# Patient Record
Sex: Female | Born: 1935 | Race: White | Hispanic: No
Health system: Southern US, Community
[De-identification: ages and names within clinical notes are randomized; demographics above are authoritative.]

---

## 2001-08-30 ENCOUNTER — Ambulatory Visit: Admission: RE | Admit: 2001-08-30 | Discharge: 2001-08-30 | Payer: Self-pay | Admitting: Pulmonary Disease

## 2015-08-21 ENCOUNTER — Other Ambulatory Visit: Payer: Self-pay | Admitting: Internal Medicine

## 2015-08-21 DIAGNOSIS — R911 Solitary pulmonary nodule: Secondary | ICD-10-CM

## 2015-08-24 ENCOUNTER — Ambulatory Visit
Admission: RE | Admit: 2015-08-24 | Discharge: 2015-08-24 | Disposition: A | Discharge status: Home / Self Care | Payer: Medicare HMO | Source: Ambulatory Visit | Attending: Internal Medicine | Admitting: Internal Medicine

## 2015-08-24 DIAGNOSIS — R911 Solitary pulmonary nodule: Secondary | ICD-10-CM

## 2015-10-22 ENCOUNTER — Inpatient Hospital Stay (HOSPITAL_COMMUNITY): Payer: Medicare HMO

## 2015-10-22 ENCOUNTER — Inpatient Hospital Stay (HOSPITAL_COMMUNITY)
Admission: EM | Admit: 2015-10-22 | Discharge: 2015-11-21 | DRG: 871 | Disposition: E | Payer: Medicare HMO | Source: Other Acute Inpatient Hospital | Attending: Internal Medicine | Admitting: Internal Medicine

## 2015-10-22 ENCOUNTER — Other Ambulatory Visit (HOSPITAL_COMMUNITY): Payer: Medicare HMO

## 2015-10-22 DIAGNOSIS — I4901 Ventricular fibrillation: Secondary | ICD-10-CM | POA: Diagnosis not present

## 2015-10-22 DIAGNOSIS — R079 Chest pain, unspecified: Secondary | ICD-10-CM | POA: Diagnosis present

## 2015-10-22 DIAGNOSIS — Z452 Encounter for adjustment and management of vascular access device: Secondary | ICD-10-CM | POA: Insufficient documentation

## 2015-10-22 DIAGNOSIS — F329 Major depressive disorder, single episode, unspecified: Secondary | ICD-10-CM | POA: Diagnosis present

## 2015-10-22 DIAGNOSIS — R6521 Severe sepsis with septic shock: Secondary | ICD-10-CM | POA: Diagnosis present

## 2015-10-22 DIAGNOSIS — Z91013 Allergy to seafood: Secondary | ICD-10-CM

## 2015-10-22 DIAGNOSIS — R579 Shock, unspecified: Secondary | ICD-10-CM | POA: Insufficient documentation

## 2015-10-22 DIAGNOSIS — E876 Hypokalemia: Secondary | ICD-10-CM | POA: Diagnosis present

## 2015-10-22 DIAGNOSIS — N179 Acute kidney failure, unspecified: Secondary | ICD-10-CM | POA: Diagnosis present

## 2015-10-22 DIAGNOSIS — R569 Unspecified convulsions: Secondary | ICD-10-CM | POA: Diagnosis present

## 2015-10-22 DIAGNOSIS — I10 Essential (primary) hypertension: Secondary | ICD-10-CM | POA: Diagnosis present

## 2015-10-22 DIAGNOSIS — J45909 Unspecified asthma, uncomplicated: Secondary | ICD-10-CM | POA: Diagnosis present

## 2015-10-22 DIAGNOSIS — K529 Noninfective gastroenteritis and colitis, unspecified: Secondary | ICD-10-CM | POA: Diagnosis present

## 2015-10-22 DIAGNOSIS — Z79899 Other long term (current) drug therapy: Secondary | ICD-10-CM | POA: Diagnosis not present

## 2015-10-22 DIAGNOSIS — R401 Stupor: Secondary | ICD-10-CM | POA: Diagnosis not present

## 2015-10-22 DIAGNOSIS — J9600 Acute respiratory failure, unspecified whether with hypoxia or hypercapnia: Secondary | ICD-10-CM | POA: Diagnosis present

## 2015-10-22 DIAGNOSIS — R402412 Glasgow coma scale score 13-15, at arrival to emergency department: Secondary | ICD-10-CM | POA: Diagnosis not present

## 2015-10-22 DIAGNOSIS — Z853 Personal history of malignant neoplasm of breast: Secondary | ICD-10-CM | POA: Diagnosis not present

## 2015-10-22 DIAGNOSIS — Z66 Do not resuscitate: Secondary | ICD-10-CM | POA: Diagnosis present

## 2015-10-22 DIAGNOSIS — T68XXXA Hypothermia, initial encounter: Secondary | ICD-10-CM | POA: Diagnosis present

## 2015-10-22 DIAGNOSIS — E785 Hyperlipidemia, unspecified: Secondary | ICD-10-CM | POA: Diagnosis present

## 2015-10-22 DIAGNOSIS — J811 Chronic pulmonary edema: Secondary | ICD-10-CM | POA: Diagnosis present

## 2015-10-22 DIAGNOSIS — E875 Hyperkalemia: Secondary | ICD-10-CM | POA: Diagnosis present

## 2015-10-22 DIAGNOSIS — E871 Hypo-osmolality and hyponatremia: Secondary | ICD-10-CM | POA: Diagnosis present

## 2015-10-22 DIAGNOSIS — G9340 Encephalopathy, unspecified: Secondary | ICD-10-CM | POA: Diagnosis not present

## 2015-10-22 DIAGNOSIS — R4182 Altered mental status, unspecified: Secondary | ICD-10-CM | POA: Insufficient documentation

## 2015-10-22 DIAGNOSIS — Z7982 Long term (current) use of aspirin: Secondary | ICD-10-CM

## 2015-10-22 DIAGNOSIS — E872 Acidosis: Secondary | ICD-10-CM | POA: Diagnosis present

## 2015-10-22 DIAGNOSIS — A419 Sepsis, unspecified organism: Principal | ICD-10-CM | POA: Insufficient documentation

## 2015-10-22 DIAGNOSIS — Z91018 Allergy to other foods: Secondary | ICD-10-CM

## 2015-10-22 LAB — COMPREHENSIVE METABOLIC PANEL
ALBUMIN: 2 g/dL — AB (ref 3.5–5.0)
ALK PHOS: 105 U/L (ref 38–126)
ALT: 12 U/L — ABNORMAL LOW (ref 14–54)
AST: 166 U/L — AB (ref 15–41)
Anion gap: 14 (ref 5–15)
BILIRUBIN TOTAL: 0.9 mg/dL (ref 0.3–1.2)
BUN: 58 mg/dL — AB (ref 6–20)
CALCIUM: 5.9 mg/dL — AB (ref 8.9–10.3)
CO2: 13 mmol/L — AB (ref 22–32)
Chloride: 94 mmol/L — ABNORMAL LOW (ref 101–111)
Creatinine, Ser: 6.44 mg/dL — ABNORMAL HIGH (ref 0.44–1.00)
GFR calc Af Amer: 6 mL/min — ABNORMAL LOW (ref >60)
GFR calc non Af Amer: 5 mL/min — ABNORMAL LOW (ref >60)
GLUCOSE: 167 mg/dL — AB (ref 65–99)
Potassium: 3.2 mmol/L — ABNORMAL LOW (ref 3.5–5.1)
SODIUM: 121 mmol/L — AB (ref 135–145)
TOTAL PROTEIN: 3.8 g/dL — AB (ref 6.5–8.1)

## 2015-10-22 LAB — PROCALCITONIN: PROCALCITONIN: 55.88 ng/mL

## 2015-10-22 LAB — APTT: APTT: 43 s — AB (ref 24–37)

## 2015-10-22 LAB — CBC WITH DIFFERENTIAL/PLATELET
BASOS PCT: 0 %
Basophils Absolute: 0 10*3/uL (ref 0.0–0.1)
EOS ABS: 0 10*3/uL (ref 0.0–0.7)
Eosinophils Relative: 0 %
HEMATOCRIT: 31.6 % — AB (ref 36.0–46.0)
Hemoglobin: 11.4 g/dL — ABNORMAL LOW (ref 12.0–15.0)
LYMPHS ABS: 0.6 10*3/uL — AB (ref 0.7–4.0)
Lymphocytes Relative: 2 %
MCH: 31.8 pg (ref 26.0–34.0)
MCHC: 36.1 g/dL — AB (ref 30.0–36.0)
MCV: 88 fL (ref 78.0–100.0)
MONO ABS: 0.9 10*3/uL (ref 0.1–1.0)
Monocytes Relative: 3 %
NEUTROS PCT: 95 %
Neutro Abs: 27.7 10*3/uL — ABNORMAL HIGH (ref 1.7–7.7)
PLATELETS: 216 10*3/uL (ref 150–400)
RBC: 3.59 MIL/uL — ABNORMAL LOW (ref 3.87–5.11)
RDW: 13.2 % (ref 11.5–15.5)
WBC: 29.2 10*3/uL — AB (ref 4.0–10.5)

## 2015-10-22 LAB — BLOOD GAS, ARTERIAL
Acid-base deficit: 14.6 mmol/L — ABNORMAL HIGH (ref 0.0–2.0)
BICARBONATE: 11.3 meq/L — AB (ref 20.0–24.0)
Drawn by: 39899
FIO2: 0.6
LHR: 20 {breaths}/min
MECHVT: 460 mL
O2 Saturation: 99 %
PATIENT TEMPERATURE: 93.6
PEEP/CPAP: 5 cmH2O
PO2 ART: 236 mmHg — AB (ref 80.0–100.0)
TCO2: 12.1 mmol/L (ref 0–100)
pCO2 arterial: 23.8 mmHg — ABNORMAL LOW (ref 35.0–45.0)
pH, Arterial: 7.279 — ABNORMAL LOW (ref 7.350–7.450)

## 2015-10-22 LAB — URINALYSIS, ROUTINE W REFLEX MICROSCOPIC
Bilirubin Urine: NEGATIVE
Glucose, UA: 100 mg/dL — AB
KETONES UR: NEGATIVE mg/dL
NITRITE: NEGATIVE
Specific Gravity, Urine: 1.02 (ref 1.005–1.030)
pH: 5 (ref 5.0–8.0)

## 2015-10-22 LAB — GLUCOSE, CAPILLARY
GLUCOSE-CAPILLARY: 124 mg/dL — AB (ref 65–99)
Glucose-Capillary: 136 mg/dL — ABNORMAL HIGH (ref 65–99)

## 2015-10-22 LAB — PROTIME-INR
INR: 1.92 — ABNORMAL HIGH (ref 0.00–1.49)
Prothrombin Time: 21.9 seconds — ABNORMAL HIGH (ref 11.6–15.2)

## 2015-10-22 LAB — URINE MICROSCOPIC-ADD ON

## 2015-10-22 LAB — AMYLASE: Amylase: 214 U/L — ABNORMAL HIGH (ref 28–100)

## 2015-10-22 LAB — MRSA PCR SCREENING: MRSA by PCR: NEGATIVE

## 2015-10-22 LAB — TYPE AND SCREEN
ABO/RH(D): A POS
ANTIBODY SCREEN: NEGATIVE

## 2015-10-22 LAB — BASIC METABOLIC PANEL
ANION GAP: 11 (ref 5–15)
BUN: 55 mg/dL — AB (ref 6–20)
CHLORIDE: 100 mmol/L — AB (ref 101–111)
CO2: 11 mmol/L — ABNORMAL LOW (ref 22–32)
Calcium: 5.3 mg/dL — CL (ref 8.9–10.3)
Creatinine, Ser: 5.73 mg/dL — ABNORMAL HIGH (ref 0.44–1.00)
GFR calc Af Amer: 7 mL/min — ABNORMAL LOW (ref >60)
GFR, EST NON AFRICAN AMERICAN: 6 mL/min — AB (ref >60)
GLUCOSE: 150 mg/dL — AB (ref 65–99)
POTASSIUM: 3 mmol/L — AB (ref 3.5–5.1)
SODIUM: 122 mmol/L — AB (ref 135–145)

## 2015-10-22 LAB — PHOSPHORUS: Phosphorus: 7.1 mg/dL — ABNORMAL HIGH (ref 2.5–4.6)

## 2015-10-22 LAB — BRAIN NATRIURETIC PEPTIDE: B NATRIURETIC PEPTIDE 5: 376.4 pg/mL — AB (ref 0.0–100.0)

## 2015-10-22 LAB — MAGNESIUM: Magnesium: 2.1 mg/dL (ref 1.7–2.4)

## 2015-10-22 LAB — LACTIC ACID, PLASMA: LACTIC ACID, VENOUS: 2.8 mmol/L — AB (ref 0.5–2.0)

## 2015-10-22 LAB — LIPASE, BLOOD: LIPASE: 144 U/L — AB (ref 11–51)

## 2015-10-22 LAB — LACTATE DEHYDROGENASE: LDH: 695 U/L — AB (ref 98–192)

## 2015-10-22 LAB — ABO/RH: ABO/RH(D): A POS

## 2015-10-22 LAB — STREP PNEUMONIAE URINARY ANTIGEN: STREP PNEUMO URINARY ANTIGEN: NEGATIVE

## 2015-10-22 LAB — TROPONIN I: Troponin I: 0.19 ng/mL — ABNORMAL HIGH (ref <0.031)

## 2015-10-22 LAB — TSH: TSH: 0.66 u[IU]/mL (ref 0.350–4.500)

## 2015-10-22 LAB — D-DIMER, QUANTITATIVE: D-Dimer, Quant: >20 ug/mL-FEU — ABNORMAL HIGH (ref 0.00–0.50)

## 2015-10-22 LAB — CORTISOL: Cortisol, Plasma: 84 ug/dL

## 2015-10-22 MED ORDER — ASPIRIN 300 MG RE SUPP
300.0000 mg | RECTAL | Status: AC
  Administered 2015-10-22: 300 mg via RECTAL
  Filled 2015-10-22: qty 1

## 2015-10-22 MED ORDER — HEPARIN BOLUS VIA INFUSION
4000.0000 [IU] | Freq: Once | INTRAVENOUS | Status: DC
  Filled 2015-10-22: qty 4000

## 2015-10-22 MED ORDER — VITAL HIGH PROTEIN PO LIQD
1000.0000 mL | ORAL | Status: DC
  Filled 2015-10-22 (×3): qty 1000

## 2015-10-22 MED ORDER — SODIUM CHLORIDE 0.9 % IV BOLUS (SEPSIS)
1000.0000 mL | INTRAVENOUS | Status: AC
  Administered 2015-10-22 (×2): 1000 mL via INTRAVENOUS

## 2015-10-22 MED ORDER — HYDROCORTISONE NA SUCCINATE PF 100 MG IJ SOLR
50.0000 mg | Freq: Four times a day (QID) | INTRAMUSCULAR | Status: DC
  Administered 2015-10-22 – 2015-10-23 (×3): 50 mg via INTRAVENOUS
  Filled 2015-10-22 (×3): qty 1
  Filled 2015-10-22 (×2): qty 2
  Filled 2015-10-22: qty 1
  Filled 2015-10-22: qty 2

## 2015-10-22 MED ORDER — ASPIRIN 81 MG PO CHEW: 324.0000 mg | CHEWABLE_TABLET | ORAL | Status: AC

## 2015-10-22 MED ORDER — SODIUM CHLORIDE 0.9 % IV SOLN: 250.0000 mL | INTRAVENOUS | Status: DC | PRN

## 2015-10-22 MED ORDER — PRO-STAT SUGAR FREE PO LIQD
30.0000 mL | Freq: Two times a day (BID) | ORAL | Status: DC
  Administered 2015-10-22 – 2015-10-23 (×2): 30 mL
  Filled 2015-10-22 (×3): qty 30

## 2015-10-22 MED ORDER — POTASSIUM CHLORIDE 10 MEQ/50ML IV SOLN
10.0000 meq | INTRAVENOUS | Status: AC
  Administered 2015-10-22 (×3): 10 meq via INTRAVENOUS
  Filled 2015-10-22 (×2): qty 50

## 2015-10-22 MED ORDER — HEPARIN SODIUM (PORCINE) 5000 UNIT/ML IJ SOLN
5000.0000 [IU] | Freq: Three times a day (TID) | INTRAMUSCULAR | Status: DC
Start: 2015-10-22 — End: 2015-10-24
  Administered 2015-10-22 – 2015-10-24 (×6): 5000 [IU] via SUBCUTANEOUS
  Filled 2015-10-22 (×10): qty 1

## 2015-10-22 MED ORDER — HEPARIN (PORCINE) IN NACL 100-0.45 UNIT/ML-% IJ SOLN: 1150.0000 [IU]/h | INTRAMUSCULAR | Status: DC

## 2015-10-22 MED ORDER — SODIUM CHLORIDE 0.9 % IV BOLUS (SEPSIS)
500.0000 mL | INTRAVENOUS | Status: AC
  Administered 2015-10-22: 500 mL via INTRAVENOUS

## 2015-10-22 MED ORDER — INSULIN ASPART 100 UNIT/ML ~~LOC~~ SOLN
0.0000 [IU] | SUBCUTANEOUS | Status: DC
  Administered 2015-10-22 – 2015-10-23 (×3): 1 [IU] via SUBCUTANEOUS
  Administered 2015-10-24: 2 [IU] via SUBCUTANEOUS

## 2015-10-22 MED ORDER — PANTOPRAZOLE SODIUM 40 MG PO PACK
40.0000 mg | PACK | Freq: Every day | ORAL | Status: DC
  Administered 2015-10-23: 40 mg
  Filled 2015-10-22 (×3): qty 20

## 2015-10-22 MED ORDER — POTASSIUM CHLORIDE 10 MEQ/50ML IV SOLN
10.0000 meq | INTRAVENOUS | Status: DC
  Administered 2015-10-22 (×2): 10 meq via INTRAVENOUS
  Filled 2015-10-22 (×3): qty 50

## 2015-10-22 MED ORDER — DEXTROSE 5 % IV SOLN
2.0000 g | INTRAVENOUS | Status: DC
  Administered 2015-10-22: 2 g via INTRAVENOUS
  Filled 2015-10-22 (×2): qty 2

## 2015-10-22 MED ORDER — LEVOFLOXACIN IN D5W 750 MG/150ML IV SOLN
750.0000 mg | Freq: Once | INTRAVENOUS | Status: AC
  Administered 2015-10-22: 750 mg via INTRAVENOUS
  Filled 2015-10-22: qty 150

## 2015-10-22 MED ORDER — NOREPINEPHRINE BITARTRATE 1 MG/ML IV SOLN
5.0000 ug/min | INTRAVENOUS | Status: DC
  Administered 2015-10-22 – 2015-10-23 (×2): 50 ug/min via INTRAVENOUS
  Administered 2015-10-24: 40 ug/min via INTRAVENOUS
  Filled 2015-10-22 (×4): qty 16

## 2015-10-22 MED ORDER — SODIUM CHLORIDE 0.9 % IV SOLN
25.0000 ug/h | INTRAVENOUS | Status: DC
  Administered 2015-10-22: 75 ug/h via INTRAVENOUS
  Administered 2015-10-23: 200 ug/h via INTRAVENOUS
  Filled 2015-10-22 (×3): qty 50

## 2015-10-22 MED ORDER — SODIUM CHLORIDE 0.9 % IV SOLN
1.0000 g | Freq: Once | INTRAVENOUS | Status: AC
  Administered 2015-10-22: 1 g via INTRAVENOUS
  Filled 2015-10-22: qty 10

## 2015-10-22 MED ORDER — NOREPINEPHRINE BITARTRATE 1 MG/ML IV SOLN
5.0000 ug/min | INTRAVENOUS | Status: DC
  Administered 2015-10-22: 50 ug/min via INTRAVENOUS
  Administered 2015-10-22: 10 ug/min via INTRAVENOUS
  Administered 2015-10-22: 50 ug/min via INTRAVENOUS
  Filled 2015-10-22 (×2): qty 4

## 2015-10-22 MED ORDER — SODIUM CHLORIDE 0.9 % IV SOLN
10.0000 ug/h | Freq: Once | INTRAVENOUS | Status: AC
  Administered 2015-10-22: 10 ug/h via INTRAVENOUS
  Filled 2015-10-22: qty 50

## 2015-10-22 MED ORDER — SODIUM CHLORIDE 0.9 % IV SOLN
1250.0000 mg | Freq: Once | INTRAVENOUS | Status: AC
  Administered 2015-10-22: 1250 mg via INTRAVENOUS
  Filled 2015-10-22: qty 1250

## 2015-10-22 MED ORDER — LEVOFLOXACIN IN D5W 500 MG/100ML IV SOLN: 500.0000 mg | INTRAVENOUS | Status: DC

## 2015-10-22 MED ORDER — SODIUM CHLORIDE 0.9 % IV SOLN
INTRAVENOUS | Status: DC
  Administered 2015-10-22: 150 mL/h via INTRAVENOUS

## 2015-10-22 NOTE — Progress Notes (Addendum)
ANTIBIOTIC CONSULT NOTE - INITIAL  Pharmacy Consult for Levaquin and Vancomycin Indication: rule out sepsis  Allergies not on file  Patient Measurements: Weight: 150 lb 12.7 oz (68.4 kg)  Vital Signs: Temp: 93.7 F (34.3 C) (01/02 1615) Temp Source: Core (Comment) (01/02 1615) BP: 85/39 mmHg (01/02 1614) Pulse Rate: 93 (01/02 1614) Intake/Output from previous day:   Intake/Output from this shift:    Labs: No results for input(s): WBC, HGB, PLT, LABCREA, CREATININE in the last 72 hours. CrCl cannot be calculated (Unknown ideal weight.). No results for input(s): VANCOTROUGH, VANCOPEAK, VANCORANDOM, GENTTROUGH, GENTPEAK, GENTRANDOM, TOBRATROUGH, TOBRAPEAK, TOBRARND, AMIKACINPEAK, AMIKACINTROU, AMIKACIN in the last 72 hours.   Microbiology: No results found for this or any previous visit (from the past 720 hour(s)).  Medical History: No past medical history on file.  Assessment: 82 yo F [facility] recent wrist surgery. Had acute chest pain and resp failure and had to be intubated. Found to be hypothermic and septic so pharmacy consulted to dose abx. Labs pending.  Goal of Therapy:  Vancomycin trough level 15-20 mcg/ml  Resolution of infection  Plan:  Give vancomycin 1.25g IV x 1 Give Levaquin 750mg  IV x 1 Change ceftriaxone to 2g IV Q24 per MD Monitor clinical picture, renal function, VT at Css F/U labs for further dosing, C&S, abx deescalation / LOT  Enzo BiNathan Jamaya Sleeth, PharmD, BCPS Clinical Pharmacist Pager [phone] 11/05/2015 4:48 PM  ADDENDUM:  SCr came back elevated at 6.44. CrCl ~5-7110ml/min. WBC elevated at 29.2.  Plan: Continue Levaquin 500mg  IV Q48 starting on 1/4 Plan to check ~48 hr level (1/4 in the evening) or follow up renal plans for patient for further vanc dosing Monitor clinical picture, renal function, VR prn F/U C&S, abx deescalation / LOT

## 2015-10-22 NOTE — Procedures (Signed)
Central Venous Catheter Insertion Procedure Note Catherine Ward [id number] [birth year]  Procedure: Insertion of Central Venous Catheter Indications: Assessment of intravascular volume, Drug and/or fluid administration, Frequent blood sampling and in case HD, also need cvp, access  Procedure Details Consent: Unable to obtain consent because of emergent medical necessity. Time Out: Verified patient identification, verified procedure, site/side was marked, verified correct patient position, special equipment/implants available, medications/allergies/relevent history reviewed, required imaging and test results available.  Performed  Maximum sterile technique was used including antiseptics, cap, gloves, gown, hand hygiene, mask and sheet. Skin prep: Chlorhexidine; local anesthetic administered A antimicrobial bonded/coated triple lumen catheter was placed in the right internal jugular vein using the Seldinger technique.  Evaluation Blood flow good Complications: No apparent complications Patient did tolerate procedure well. Chest X-ray ordered to verify placement.  CXR: pending.  Nelda BucksFEINSTEIN,DANIEL J. 11/04/2015, 5:16 PM  US  Mcarthur Rossettianiel J. Tyson AliasFeinstein, MD, FACP Pgr: [phone] [facility]

## 2015-10-22 NOTE — Progress Notes (Signed)
eLink Physician-Brief Progress Note Patient Name: Catherine Ward DOB: 01-07-1933 MRN: [id number]   Date of Service  11/12/2015  HPI/Events of Note  Patient with limited IV access and R IJ HD catheter. Patient requiring vasopressor and fentanyl infusion. Combative. Potassium only 3.0 still but LA improving. Camera check shows patient now comfortable post fentanyl bolus.  eICU Interventions  1. Plan to place L IJ CVL 2. KCl x4 more runs 3. Continue following electrolytes     Intervention Category Major Interventions: Sepsis - evaluation and management  Lawanda CousinsJennings Colbie Sliker 10/30/2015, 8:07 PM

## 2015-10-22 NOTE — Procedures (Signed)
Arterial Catheter Insertion Procedure Note Clayburn PertDoris J Graef [id number] 1933/06/04  Procedure: Insertion of Arterial Catheter  Indications: Blood pressure monitoring and Frequent blood sampling  Procedure Details Consent: Unable to obtain consent because of emergent medical necessity. Time Out: Verified patient identification, verified procedure, site/side was marked, verified correct patient position, special equipment/implants available, medications/allergies/relevent history reviewed, required imaging and test results available.  Performed  Maximum sterile technique was used including antiseptics, cap, gloves, gown, hand hygiene, mask and sheet. Skin prep: Chlorhexidine; local anesthetic administered 20 gauge catheter was inserted into left femoral artery using the Seldinger technique.  Evaluation Blood flow good; BP tracing good. Complications: No apparent complications.   Nelda BucksFEINSTEIN,Ollis Daudelin J. 11/19/2015  US  No radials  Mcarthur Rossettianiel J. Tyson AliasFeinstein, MD, FACP Pgr: [phone] [facility]

## 2015-10-22 NOTE — H&P (Signed)
PULMONARY / CRITICAL CARE MEDICINE   Name: Catherine Ward MRN: [id number] DOB: 09/25/1933    ADMISSION DATE:  11/17/2015 CONSULTATION DATE:  10/28/2015  REFERRING MD:  ER EDP Duke Salvia  CHIEF COMPLAINT:  Chest pain, resp failure, shock  HISTORY OF PRESENT ILLNESS:   82 yr old presents with recent wrist surgery presented to [facility] with acute chest pain, resp failure. NOted WBC 21 k, temp 95 f x 1. [facility]ed to [facility], [facility], intubated.  Found with MODS< ARF, crt 6, shock, levophed, fem lineplaced at [place], unable to give contrast for studies. CT abdo neg, no hydro on CT. Had SOB reported with CP.  Also with hyponatremia, LActic over 8 with all AG acidosis. Glu borderline 66. Appearing dry. Arrived to [facility] . NO disc, all labs only and reports.  ECG without st changes, trop neg.  PAST MEDICAL HISTORY :  She  has no past medical history on file.  Wrist surgery, depression, never smoker, HTN, Hyperlipidemia, Breast Cancer  PAST SURGICAL HISTORY: She  has no past surgical history on file.Tonsillectoy, Adnoid  Allergies not on file  No current facility-administered medications on file prior to encounter.   No current outpatient prescriptions on file prior to encounter.    FAMILY HISTORY:  Her has no family status information on file.   SOCIAL HISTORY: She   no etoh, no drug abus ein past  REVIEW OF SYSTEMS:   Unable  SUBJECTIVE:  vented  VITAL SIGNS: BP 85/39 mmHg  Pulse 93  Temp(Src) 93.7 F (34.3 C)  Resp 18  SpO2 67%  HEMODYNAMICS:    VENTILATOR SETTINGS: Vent Mode:  [-] PRVC FiO2 (%):  [60 %] 60 % Set Rate:  [16 bmp] 16 bmp Vt Set:  [460 mL] 460 mL PEEP:  [5 cmH20] 5 cmH20 Plateau Pressure:  [26 cmH20] 26 cmH20  INTAKE / OUTPUT:    PHYSICAL EXAMINATION: General: lethargic , int agitation Neuro:  perrl 2 mm, moving al ext equally HEENT:  jvd none, ett Cardiovascular:  s1 s2 RRR no r, distant Lungs:  CTA anterior, scar left chest Abdomen:   Soft, BS hypo, ND, tender mild diffuse  Musculoskeletal:  No edema Skin:  No rash  LABS:  BMET No results for input(s): NA, K, CL, CO2, BUN, CREATININE, GLUCOSE in the last 168 hours.  Electrolytes No results for input(s): CALCIUM, MG, PHOS in the last 168 hours.  CBC No results for input(s): WBC, HGB, HCT, PLT in the last 168 hours.  Coag's No results for input(s): APTT, INR in the last 168 hours.  Sepsis Markers No results for input(s): LATICACIDVEN, PROCALCITON, O2SATVEN in the last 168 hours.  ABG No results for input(s): PHART, PCO2ART, PO2ART in the last 168 hours.  Liver Enzymes No results for input(s): AST, ALT, ALKPHOS, BILITOT, ALBUMIN in the last 168 hours.  Cardiac Enzymes No results for input(s): TROPONINI, PROBNP in the last 168 hours.  Glucose No results for input(s): GLUCAP in the last 168 hours.  Imaging No results found.   STUDIES:  1/2 CT chest (NONcontrast)>>>nodule 1/2 CT abdo>>>gallstones LImited echo>>>rv wnl, LV wnl, hypovolemic appearing  CULTURES: 1/2 BC>>> 1/2 urine>>> 1/2 sputum>>> 1/2 leg>>> 1/2 strep  ANTIBIOTICS: 1/2 levo>>> 1/2 vanc>>> 1/2 ceftriaxone>>  SIGNIFICANT EVENTS: 1/2 shock, resp failure, lactic acidosis  LINES/TUBES: 1/2 ETT>>>   ASSESSMENT / PLAN:  PULMONARY A:Acute resp failure, metabolic acidosis, likely uncompensated R/o developing pNA P:   STAT ABG on vent Rate 20, 100% , peep 5  pcxr stat  CARDIOVASCULAR A:  Shock, r/o hypovolemic, septic Echo NOT c/w PE massive / obstructive Aorta noral size on NONCointrast CT P:  Levophed to MAP 60 STAT a line needed Stat cvp Cortisol tesh ecg Trop Lactic repeat offical echo IF declines would TEE for aortic dissection   RENAL A:   ARF, r/o; ATN, hypovolemia Hyponatremia Hypokalemia AG acidosis (lactic, uremia) P:   CT neg hydro, NO US needed BMET STAT and q6h May need bicarb if K up volue Saline cvp  GASTROINTESTINAL A:   Back  pain, r/o abdominal source, CVT neg , no contrast however On differential , dead bowel P:   CT reviewed, if declines, obtain with oral contrast and may need surgical cosnult if no other source noted ppi LFT, amy,lip repeat, ldh Npo Volume  HEMATOLOGIC A:   DVt  Prevention Echo reassuring of PE P:  Low threshold empiric heparin  INFECTIOUS A:   R/o bacteremia, septic shock UTI mild unlikely source R/o developing PNA P:   STAT pcxr, ua Empiric Levofloxacin 1/2>>> vanc 1/2>>> Ceftriaxone 1/2>>>  Get leg, strep ag  ENDOCRINE A:   R/o Adrenal insufff P:   tsh Cortisol cbg  NEUROLOGIC A:   Septic enceph likely P:   RASS goal: -1 May need CT head fent required  FAMILY  - Updates: no family  - Inter-disciplinary family meet or Palliative Care meeting due by:  10/29/15  Ccm time 90 min  Mcarthur Rossettianiel J. Tyson AliasFeinstein, MD, FACP Pgr: [phone] [place] [facility]  Pulmonary and Critical Care Medicine [facility] Pager: [phone]  11/11/2015, 4:25 PM

## 2015-10-22 NOTE — Progress Notes (Signed)
VASCULAR LAB PRELIMINARY  PRELIMINARY  PRELIMINARY  PRELIMINARY  Bilateral lower extremity venous duplex completed.    Preliminary report:  Bilateral:  No obvious evidence of DVT, superficial thrombosis, or Baker's Cyst. Technically difficult and limited due to line placement in the groin bilaterally, suboptimal positioning of the patient , and the warming blanket.   Shelden Raborn, RVS 11/18/2015, 7:02 PM

## 2015-10-22 NOTE — Procedures (Signed)
Echo, limited stat  1. LV small cavity, hypovolemic, mid cavity gradient 2. RV kinetic and slender (not c/w massive PE) 3. small to no sig pericardial effusion 4. Poor apical images  Mcarthur RossettiDaniel J. Tyson AliasFeinstein, MD, FACP Pgr: [phone] [place] [facility]

## 2015-10-22 NOTE — Progress Notes (Addendum)
ANTICOAGULATION CONSULT NOTE - Initial Consult  Pharmacy Consult for heparin Indication: VTE treatment  Allergies not on file  Patient Measurements: TBW 68.4 kg IBW ? But looks to be around IBW  Vital Signs: Temp: 93.7 F (34.3 C) (01/02 1615) Temp Source: Core (Comment) (01/02 1615) BP: 85/39 mmHg (01/02 1614) Pulse Rate: 93 (01/02 1614)  Labs: No results for input(s): HGB, HCT, PLT, APTT, LABPROT, INR, HEPARINUNFRC, CREATININE, CKTOTAL, CKMB, TROPONINI in the last 72 hours.  CrCl cannot be calculated (Unknown ideal weight.).   Medical History: No past medical history on file.  Assessment: 82 yo F [facility] wrist [facility]. Had acute chest pain and resp failure and had to be intubated. Worried about acute PE so pharmacy consulted to start heparin. Labs in process. No anticoag PTA.   Goal of Therapy:  Heparin level 0.3-0.7 units/ml Monitor platelets by anticoagulation protocol: Yes   Plan:  Give 4,000 heparin BOLUS Start heparin gtt at 1,150 units/hr Check 8 hr HL Monitor daily HL, CBC, s/s of bleed  Enzo BiNathan Chantele Corado, PharmD, BCPS Clinical Pharmacist Pager [phone] 10/26/2015 4:37 PM   ADDENDUM:  Heparin stopped before it started per MD. PE not likely.

## 2015-10-23 ENCOUNTER — Inpatient Hospital Stay (HOSPITAL_COMMUNITY): Payer: Medicare HMO

## 2015-10-23 DIAGNOSIS — R401 Stupor: Secondary | ICD-10-CM

## 2015-10-23 DIAGNOSIS — R4182 Altered mental status, unspecified: Secondary | ICD-10-CM | POA: Insufficient documentation

## 2015-10-23 DIAGNOSIS — A419 Sepsis, unspecified organism: Principal | ICD-10-CM

## 2015-10-23 LAB — GLUCOSE, CAPILLARY
GLUCOSE-CAPILLARY: 138 mg/dL — AB (ref 65–99)
GLUCOSE-CAPILLARY: 44 mg/dL — AB (ref 65–99)
GLUCOSE-CAPILLARY: 91 mg/dL (ref 65–99)
Glucose-Capillary: 56 mg/dL — ABNORMAL LOW (ref 65–99)
Glucose-Capillary: 85 mg/dL (ref 65–99)
Glucose-Capillary: 87 mg/dL (ref 65–99)
Glucose-Capillary: 71 mg/dL (ref 65–99)

## 2015-10-23 LAB — BASIC METABOLIC PANEL
ANION GAP: 10 (ref 5–15)
ANION GAP: 9 (ref 5–15)
BUN: 59 mg/dL — ABNORMAL HIGH (ref 6–20)
BUN: 53 mg/dL — AB (ref 6–20)
CHLORIDE: 99 mmol/L — AB (ref 101–111)
CHLORIDE: 102 mmol/L (ref 101–111)
CO2: 12 mmol/L — ABNORMAL LOW (ref 22–32)
CO2: 9 mmol/L — ABNORMAL LOW (ref 22–32)
Calcium: 5.3 mg/dL — CL (ref 8.9–10.3)
Calcium: 5.5 mg/dL — CL (ref 8.9–10.3)
Creatinine, Ser: 5.68 mg/dL — ABNORMAL HIGH (ref 0.44–1.00)
Creatinine, Ser: 6.26 mg/dL — ABNORMAL HIGH (ref 0.44–1.00)
GFR calc Af Amer: 7 mL/min — ABNORMAL LOW (ref >60)
GFR, EST AFRICAN AMERICAN: 7 mL/min — AB (ref >60)
GFR, EST NON AFRICAN AMERICAN: 6 mL/min — AB (ref >60)
GFR, EST NON AFRICAN AMERICAN: 6 mL/min — AB (ref >60)
Glucose, Bld: 142 mg/dL — ABNORMAL HIGH (ref 65–99)
Glucose, Bld: 97 mg/dL (ref 65–99)
POTASSIUM: 4.1 mmol/L (ref 3.5–5.1)
POTASSIUM: 4.5 mmol/L (ref 3.5–5.1)
SODIUM: 121 mmol/L — AB (ref 135–145)
SODIUM: 120 mmol/L — AB (ref 135–145)

## 2015-10-23 LAB — CBC
HEMATOCRIT: 33.3 % — AB (ref 36.0–46.0)
Hemoglobin: 11.6 g/dL — ABNORMAL LOW (ref 12.0–15.0)
MCH: 30.4 pg (ref 26.0–34.0)
MCHC: 34.8 g/dL (ref 30.0–36.0)
MCV: 87.2 fL (ref 78.0–100.0)
Platelets: 215 10*3/uL (ref 150–400)
RBC: 3.82 MIL/uL — ABNORMAL LOW (ref 3.87–5.11)
RDW: 13.4 % (ref 11.5–15.5)
WBC: 26.4 10*3/uL — AB (ref 4.0–10.5)

## 2015-10-23 LAB — PHOSPHORUS: Phosphorus: 3.1 mg/dL (ref 2.5–4.6)

## 2015-10-23 LAB — MAGNESIUM: Magnesium: 1.6 mg/dL — ABNORMAL LOW (ref 1.7–2.4)

## 2015-10-23 LAB — URINE CULTURE
CULTURE: NO GROWTH
SPECIAL REQUESTS: NORMAL

## 2015-10-23 LAB — TROPONIN I
TROPONIN I: 1.16 ng/mL — AB (ref <0.031)
TROPONIN I: 2.59 ng/mL — AB (ref <0.031)

## 2015-10-23 LAB — POCT I-STAT 3, ART BLOOD GAS (G3+)
Acid-base deficit: 16 mmol/L — ABNORMAL HIGH (ref 0.0–2.0)
Bicarbonate: 9.6 meq/L — ABNORMAL LOW (ref 20.0–24.0)
O2 Saturation: 99 %
Patient temperature: 98.7
TCO2: 10 mmol/L (ref 0–100)
pCO2 arterial: 22.4 mmHg — ABNORMAL LOW (ref 35.0–45.0)
pH, Arterial: 7.241 — ABNORMAL LOW (ref 7.350–7.450)
pO2, Arterial: 187 mmHg — ABNORMAL HIGH (ref 80.0–100.0)

## 2015-10-23 LAB — CARBOXYHEMOGLOBIN
Carboxyhemoglobin: 0.7 % (ref 0.5–1.5)
Methemoglobin: 1.2 % (ref 0.0–1.5)
O2 Saturation: 81.2 %
Total hemoglobin: 11.8 g/dL — ABNORMAL LOW (ref 12.0–16.0)

## 2015-10-23 LAB — HEPATIC FUNCTION PANEL
ALT: 18 U/L (ref 14–54)
AST: 252 U/L — ABNORMAL HIGH (ref 15–41)
Albumin: 1.8 g/dL — ABNORMAL LOW (ref 3.5–5.0)
Alkaline Phosphatase: 118 U/L (ref 38–126)
BILIRUBIN DIRECT: 0.2 mg/dL (ref 0.1–0.5)
BILIRUBIN INDIRECT: 0.3 mg/dL (ref 0.3–0.9)
BILIRUBIN TOTAL: 0.5 mg/dL (ref 0.3–1.2)
Total Protein: 3.6 g/dL — ABNORMAL LOW (ref 6.5–8.1)

## 2015-10-23 LAB — POCT ACTIVATED CLOTTING TIME: ACTIVATED CLOTTING TIME: 162 s

## 2015-10-23 LAB — OSMOLALITY: OSMOLALITY: 277 mosm/kg (ref 275–295)

## 2015-10-23 MED ORDER — SODIUM CHLORIDE 0.9 % IV SOLN
200.0000 mg | Freq: Once | INTRAVENOUS | Status: AC
  Administered 2015-10-23: 200 mg via INTRAVENOUS
  Filled 2015-10-23: qty 200

## 2015-10-23 MED ORDER — PIPERACILLIN-TAZOBACTAM IN DEX 2-0.25 GM/50ML IV SOLN
2.2500 g | Freq: Once | INTRAVENOUS | Status: AC
Start: 2015-10-23 — End: 2015-10-23
  Administered 2015-10-23: 2.25 g via INTRAVENOUS
  Filled 2015-10-23: qty 50

## 2015-10-23 MED ORDER — ETOMIDATE 2 MG/ML IV SOLN
5.0000 mg | Freq: Once | INTRAVENOUS | Status: AC
  Administered 2015-10-23: 5 mg via INTRAVENOUS

## 2015-10-23 MED ORDER — HEPARIN BOLUS VIA INFUSION (CRRT)
1000.0000 [IU] | INTRAVENOUS | Status: DC | PRN
  Filled 2015-10-23: qty 1000

## 2015-10-23 MED ORDER — PRISMASOL BGK 4/2.5 32-4-2.5 MEQ/L IV SOLN
INTRAVENOUS | Status: DC
  Administered 2015-10-23: 16:00:00 via INTRAVENOUS_CENTRAL
  Filled 2015-10-23 (×3): qty 5000

## 2015-10-23 MED ORDER — SODIUM CHLORIDE 0.9 % IJ SOLN
250.0000 [IU]/h | INTRAMUSCULAR | Status: DC
  Administered 2015-10-23: 250 [IU]/h via INTRAVENOUS_CENTRAL
  Filled 2015-10-23: qty 2

## 2015-10-23 MED ORDER — SODIUM CHLORIDE 0.9 % IV SOLN
100.0000 mg | INTRAVENOUS | Status: DC
  Filled 2015-10-23: qty 100

## 2015-10-23 MED ORDER — HEPARIN SODIUM (PORCINE) 1000 UNIT/ML DIALYSIS
1000.0000 [IU] | INTRAMUSCULAR | Status: DC | PRN
  Filled 2015-10-23: qty 3
  Filled 2015-10-23: qty 6

## 2015-10-23 MED ORDER — HEPARIN SODIUM (PORCINE) 1000 UNIT/ML DIALYSIS
1000.0000 [IU] | INTRAMUSCULAR | Status: DC | PRN
  Administered 2015-10-23 – 2015-10-24 (×3): 3000 [IU] via INTRAVENOUS_CENTRAL
  Filled 2015-10-23: qty 6
  Filled 2015-10-23: qty 1
  Filled 2015-10-23 (×3): qty 6
  Filled 2015-10-23: qty 3

## 2015-10-23 MED ORDER — DEXTROSE 50 % IV SOLN
INTRAVENOUS | Status: AC
  Administered 2015-10-23: 50 mL
  Filled 2015-10-23: qty 50

## 2015-10-23 MED ORDER — CHLORHEXIDINE GLUCONATE 0.12% ORAL RINSE (MEDLINE KIT)
15.0000 mL | Freq: Two times a day (BID) | OROMUCOSAL | Status: DC
  Administered 2015-10-23 – 2015-10-24 (×4): 15 mL via OROMUCOSAL

## 2015-10-23 MED ORDER — VASOPRESSIN 20 UNIT/ML IV SOLN
0.0300 [IU]/min | INTRAVENOUS | Status: DC
  Administered 2015-10-23 – 2015-10-24 (×2): 0.03 [IU]/min via INTRAVENOUS
  Filled 2015-10-23 (×2): qty 2

## 2015-10-23 MED ORDER — PRISMASOL BGK 4/2.5 32-4-2.5 MEQ/L IV SOLN
INTRAVENOUS | Status: DC
  Administered 2015-10-23: 16:00:00 via INTRAVENOUS_CENTRAL
  Filled 2015-10-23 (×2): qty 5000

## 2015-10-23 MED ORDER — SODIUM CHLORIDE 0.45 % IV SOLN
INTRAVENOUS | Status: DC
  Administered 2015-10-23: 10:00:00 via INTRAVENOUS

## 2015-10-23 MED ORDER — HEPARIN (PORCINE) 2000 UNITS/L FOR CRRT
INTRAVENOUS_CENTRAL | Status: DC | PRN
  Administered 2015-10-23: 16:00:00 via INTRAVENOUS_CENTRAL
  Filled 2015-10-23 (×2): qty 1000

## 2015-10-23 MED ORDER — LEVOFLOXACIN IN D5W 250 MG/50ML IV SOLN
250.0000 mg | INTRAVENOUS | Status: DC
  Filled 2015-10-23: qty 50

## 2015-10-23 MED ORDER — VITAL AF 1.2 CAL PO LIQD
1000.0000 mL | ORAL | Status: DC
  Administered 2015-10-23: 1000 mL
  Filled 2015-10-23 (×4): qty 1000

## 2015-10-23 MED ORDER — PRISMASOL BGK 4/2.5 32-4-2.5 MEQ/L IV SOLN
INTRAVENOUS | Status: DC
  Administered 2015-10-23: 16:00:00 via INTRAVENOUS_CENTRAL
  Filled 2015-10-23 (×8): qty 5000

## 2015-10-23 MED ORDER — ASPIRIN 81 MG PO CHEW
81.0000 mg | CHEWABLE_TABLET | Freq: Every day | ORAL | Status: DC
  Administered 2015-10-23: 81 mg
  Filled 2015-10-23: qty 1

## 2015-10-23 MED ORDER — PRO-STAT SUGAR FREE PO LIQD
30.0000 mL | Freq: Two times a day (BID) | ORAL | Status: DC
  Administered 2015-10-23: 30 mL
  Filled 2015-10-23 (×2): qty 30

## 2015-10-23 MED ORDER — SODIUM CHLORIDE 0.9 % IV SOLN
1.0000 g | Freq: Once | INTRAVENOUS | Status: AC
  Administered 2015-10-23: 1 g via INTRAVENOUS
  Filled 2015-10-23: qty 10

## 2015-10-23 MED ORDER — ETOMIDATE 2 MG/ML IV SOLN
INTRAVENOUS | Status: AC
  Filled 2015-10-23: qty 10

## 2015-10-23 MED ORDER — IOHEXOL 300 MG/ML  SOLN
25.0000 mL | INTRAMUSCULAR | Status: AC
  Administered 2015-10-23 (×2): 25 mL via ORAL

## 2015-10-23 MED ORDER — PIPERACILLIN-TAZOBACTAM 3.375 G IVPB 30 MIN
3.3750 g | Freq: Four times a day (QID) | INTRAVENOUS | Status: DC
  Administered 2015-10-23 – 2015-10-24 (×3): 3.375 g via INTRAVENOUS
  Filled 2015-10-23 (×5): qty 50

## 2015-10-23 MED ORDER — ANTISEPTIC ORAL RINSE SOLUTION (CORINZ)
7.0000 mL | Freq: Four times a day (QID) | OROMUCOSAL | Status: DC
  Administered 2015-10-23 – 2015-10-24 (×4): 7 mL via OROMUCOSAL

## 2015-10-23 MED ORDER — SODIUM BICARBONATE 8.4 % IV SOLN
INTRAVENOUS | Status: DC
  Administered 2015-10-23: 11:00:00 via INTRAVENOUS
  Filled 2015-10-23 (×6): qty 150

## 2015-10-23 MED FILL — Dextrose Inj 50%: INTRAVENOUS | Qty: 50 | Status: AC

## 2015-10-23 NOTE — Significant Event (Signed)
CRITICAL VALUE ALERT  Critical value received:  Calcium 5.3  Date of notification:  10/23/2015  Time of notification: 1218pm  Critical value read back: yes  Nurse who received alert: Kamaury Cutbirth   MD notified (1st page): Dr. Dimple Caseyice   Time of first page:  1225pm  MD notified (2nd page):  Time of second page:  Responding MD: Dr. Dimple Caseyice   Time MD responded: 1225pm

## 2015-10-23 NOTE — Progress Notes (Signed)
eLink Physician-Brief Progress Note Patient Name: Clayburn PertDoris J Eagleson DOB: [birth year] MRN: [id number]   Date of Service  10/23/2015  HPI/Events of Note  Total ca 5.5, corrects to 7.1.   eICU Interventions  Ca gluconate x 1 and follow for further correction     Intervention Category Minor Interventions: Electrolytes abnormality - evaluation and management  Linlee Cromie S. 10/23/2015, 12:59 AM

## 2015-10-23 NOTE — Progress Notes (Signed)
Initial Nutrition Assessment  DOCUMENTATION CODES:   Not applicable  INTERVENTION:    Initiate TF via OGT with Vital AF 1.2 at 25 ml/h and Prostat 30 ml BID on day 1; on day 2, d/c Prostat and increase to goal rate of 60 ml/h (1440 ml per day) to provide 1728 kcals, 108 gm protein, 1168 ml free water daily.  NUTRITION DIAGNOSIS:   Inadequate oral intake related to inability to eat as evidenced by NPO status.  GOAL:   Patient will meet greater than or equal to 90% of their needs  MONITOR:   Vent status, Labs, Weight trends, TF tolerance, I & O's  REASON FOR ASSESSMENT:   Consult Enteral/tube feeding initiation and management  ASSESSMENT:   82 yr old presents with recent wrist surgery [facility] with acute chest pain, resp failure. [facility]   Patient is currently intubated on ventilator support MV: 18.2 L/min Temp (24hrs), Avg:96.6 F (35.9 C), Min:88.5 F (31.4 C), Max:100.6 F (38.1 C)   Nutrition focused physical exam completed.  No muscle or subcutaneous fat depletion noticed. Received MD Consult for TF initiation and management.  Diet Order:  Diet NPO time specified  Skin:  Reviewed, no issues  Last BM:  unknown  Height:   Ht Readings from Last 1 Encounters:  11/02/2015 5\' 3"  (1.6 m)    Weight:   Wt Readings from Last 1 Encounters:  10/23/15 158 lb 8.2 oz (71.9 kg)    Ideal Body Weight:  52.3 kg  BMI:  Body mass index is 28.09 kg/(m^2).  Estimated Nutritional Needs:   Kcal:  1832  Protein:  100-110 gm  Fluid:  1.8 L  EDUCATION NEEDS:   No education needs identified at this time  Joaquin CourtsKimberly Kamaya Keckler, RD, LDN, CNSC Pager [phone] After Hours Pager [phone]

## 2015-10-23 NOTE — Procedures (Signed)
Central Venous Catheter Insertion Procedure Note Catherine Ward [id number] 06/06/1933  Procedure: Insertion of Central Venous Catheter Indications: Assessment of intravascular volume, Drug and/or fluid administration and Frequent blood sampling  Procedure Details Consent: Risks of procedure as well as the alternatives and risks of each were explained to the (patient/caregiver).  Consent for procedure obtained. Time Out: Verified patient identification, verified procedure, site/side was marked, verified correct patient position, special equipment/implants available, medications/allergies/relevent history reviewed, required imaging and test results available.  Performed  Maximum sterile technique was used including antiseptics, cap, gloves, gown, hand hygiene, mask and sheet. Skin prep: Chlorhexidine; local anesthetic administered A antimicrobial bonded/coated triple lumen catheter was placed in the left internal jugular vein using the Seldinger technique.  Evaluation Blood flow good Complications: No apparent complications Patient did tolerate procedure well. Chest X-ray ordered to verify placement.  CXR: pending.  Nelda BucksFEINSTEIN,Catherine Cleek J. 10/23/2015, 3:18 PM   US  Mcarthur Rossettianiel J. Tyson AliasFeinstein, MD, FACP Pgr: [phone] [place] [facility]

## 2015-10-23 NOTE — Progress Notes (Signed)
Utilization review completed. Ryelan Kazee, RN, BSN. 

## 2015-10-23 NOTE — Consult Note (Signed)
[facility] Consultation Note Requesting Physician:  Dr. Tyson AliasFeinstein Reason for Consult:  Acute renal failure, metabolic acidosis, septic shock  HPI: The patient is a 82 y.o. year-old woman who  Was transferred [facility] for respiratory failure, shockl. She has background of HTN (on ARB), HLD, breast cancer, asthma. Had some recent wrist surgery, given keflex for cellulitus. [facility] with CP, hypotension, hypothermia. WBC was [id number], creatinine 6, sodium 112, lactic acid >8. Transferred here. Has rec'd volume, is on pressor support. We are asked to see. Baseline creatinine not known and not [facility]. Pt was on losartan PTA.  (Also on [facility] med rec - shows INH and prednisone???)  CREATININE, SER  Date/Time Value Ref Range Status  10/23/2015 04:10 AM 5.70* 0.44 - 1.00 mg/dL Final  60/45/409806-08-17 11:9111:40 PM 5.68* 0.44 - 1.00 mg/dL Final  47/82/956206-08-17 13:0807:08 PM 5.73* 0.44 - 1.00 mg/dL Final  65/78/469606-08-17 29:5204:30 PM 6.44* 0.44 - 1.00 mg/dL Final    Past Medical History: No past medical history on file. HTN HLD Asthma Breast Ca  Past Surgical History: No past surgical history on file. Prior left breast lumpectomy for CA Wrist surgery  Family History: No family history on file. Social History:  has no tobacco, alcohol, and drug history on file.  Allergies:  Allergies  Allergen Reactions  . Chocolate Flavor Nausea Only  . Shellfish Allergy Hives    Home medications: Prior to Admission medications   Medication Sig Start Date End Date Taking? Authorizing Provider  acetaminophen (TYLENOL) 500 MG tablet Take 1,000 mg by mouth every 6 (six) hours as needed for mild pain.   Yes Historical Provider, MD  albuterol (PROVENTIL HFA;VENTOLIN HFA) 108 (90 Base) MCG/ACT inhaler Inhale 2 puffs into the lungs every 6 (six) hours as needed for wheezing or shortness of breath.   Yes Historical Provider, MD  aspirin EC 81 MG tablet Take 81 mg by mouth daily.   Yes  Historical Provider, MD  Cholecalciferol (VITAMIN D3) 5000 units TABS Take 5,000 Units by mouth daily.   Yes Historical Provider, MD  diazepam (VALIUM) 5 MG tablet Take 5 mg by mouth every 8 (eight) hours as needed (dizziness control).  10/18/15  Yes Historical Provider, MD  hydrALAZINE (APRESOLINE) 25 MG tablet Take 12.5 mg by mouth 3 (three) times daily.   Yes Historical Provider, MD  isoniazid (NYDRAZID) 300 MG tablet Take 300 mg by mouth daily. 09/24/15  Yes Historical Provider, MD  losartan-hydrochlorothiazide (HYZAAR) 100-25 MG tablet Take 1 tablet by mouth daily.   Yes Historical Provider, MD  meclizine (ANTIVERT) 25 MG tablet Take 25 mg by mouth 3 (three) times daily. 10/04/15  Yes Historical Provider, MD  metoprolol succinate (TOPROL-XL) 25 MG 24 hr tablet Take 25 mg by mouth daily. 08/03/15  Yes Historical Provider, MD  ondansetron (ZOFRAN) 4 MG tablet Take 4 mg by mouth 3 (three) times daily as needed for nausea.  10/04/15  Yes Historical Provider, MD  predniSONE (DELTASONE) 20 MG tablet Take 60 mg by mouth 3 days. 10/20/15  Yes Historical Provider, MD  rosuvastatin (CRESTOR) 40 MG tablet Take 40 mg by mouth daily. 08/27/15  Yes Historical Provider, MD  traMADol (ULTRAM) 50 MG tablet Take 50 mg by mouth every 6 (six) hours as needed for moderate pain.  10/20/15  Yes Historical Provider, MD  TRAVATAN Z 0.004 % SOLN ophthalmic solution Place 1 drop into both eyes at bedtime. 08/20/15  Yes Historical Provider, MD  Inpatient medications: . anidulafungin  200 mg Intravenous Once  . antiseptic oral rinse  7 mL Mouth Rinse QID  . aspirin  81 mg Per Tube Daily  . chlorhexidine gluconate  15 mL Mouth Rinse BID  . feeding supplement (PRO-STAT SUGAR FREE 64)  30 mL Per Tube BID  . feeding supplement (VITAL HIGH PROTEIN)  1,000 mL Per Tube Q24H  . heparin subcutaneous  5,000 Units Subcutaneous 3 times per day  . insulin aspart  0-9 Units Subcutaneous 6 times per day  . iohexol  25 mL Oral Q1  Hr x 2  . [START ON November 11, 2015] levofloxacin (LEVAQUIN) IV  500 mg Intravenous Q48H  . pantoprazole sodium  40 mg Per Tube Daily  . piperacillin-tazobactam (ZOSYN)  IV  2.25 g Intravenous Once   . sodium chloride 10 mL/hr at 10/23/15 1020  . fentaNYL infusion INTRAVENOUS 100 mcg/hr (10/23/15 0745)  . norepinephrine (LEVOPHED) Adult infusion 15 mcg/min (10/23/15 1038)  .  sodium bicarbonate  infusion 1000 mL 75 mL/hr at 10/23/15 1049  . vasopressin (PITRESSIN) infusion - *FOR SHOCK* 0.03 Units/min (10/23/15 1007)   Review of Systems Unobtainable (pt intubated)    Physical Exam: BP 91/53 mmHg  Pulse 76  Temp(Src) 99.3 F (37.4 C) (Core (Comment))  Resp 30  Ht 5\' 3"  (1.6 m)  Wt 71.9 kg (158 lb 8.2 oz)  BMI 28.09 kg/m2  SpO2 97%  Gen: Ill app elderly female Intubated R IJ trialysis catheter (1/3) Hands in mittens Skin: no rash Neck: Cannot vis neck veins Chest: Bilat coarse rhonchi Heart: Regular S1S2 No S3 Ht sound distant Abdomen: soft, mod distension. Quiet. Ext: Cyanotic toes both fee/cool Neuro: Intubated, responds to pain  Labs: Basic Metabolic Panel:  Recent Labs Lab 11/10/2015 1630 11/09/2015 1908 11/10/2015 2340 10/23/15 0410  NA 121* 122* 121* 121*  K 3.2* 3.0* 4.1 4.0  CL 94* 100* 99* 102  CO2 13* 11* 12* 11*  GLUCOSE 167* 150* 142* 91  BUN 58* 55* 53* 55*  CREATININE 6.44* 5.73* 5.68* 5.70*  CALCIUM 5.9* 5.3* 5.5* 5.8*  PHOS 7.1*  --   --  3.1   Liver Function Tests:  Recent Labs Lab 11/13/2015 1630 10/26/2015 1908  AST 166* 252*  ALT 12* 18  ALKPHOS 105 118  BILITOT 0.9 0.5  PROT 3.8* 3.6*  ALBUMIN 2.0* 1.8*    Recent Labs Lab 11/01/2015 1630  LIPASE 144*  AMYLASE 214*   CBC:  Recent Labs Lab 11/05/2015 1630 10/23/15 0410  WBC 29.2* 26.4*  NEUTROABS 27.7*  --   HGB 11.4* 11.6*  HCT 31.6* 33.3*  MCV 88.0 87.2  PLT 216 215     Recent Labs Lab 11/18/2015 1630 11/07/2015 2340 10/23/15 0410  TROPONINI 0.19* 1.16* 2.59*   CBG:  Recent  Labs Lab 10/30/2015 1634 11/18/2015 2018 10/23/15 0018 10/23/15 0412 10/23/15 0738  GLUCAP 124* 136* 138* 87 71   ABG    Component Value Date/Time   PHART 7.241* 10/23/2015 0418   PCO2ART 22.4* 10/23/2015 0418   PO2ART 187.0* 10/23/2015 0418   HCO3 9.6* 10/23/2015 0418   TCO2 10 10/23/2015 0418   ACIDBASEDEF 16.0* 10/23/2015 0418   O2SAT 99.0 10/23/2015 0418    Xrays/Other Studies: Dg Chest Port 1 View  10/23/2015  CLINICAL DATA:  Respiratory failure. EXAM: PORTABLE CHEST 1 VIEW COMPARISON:  10/23/2015 . FINDINGS: Endotracheal tube, NG tube, right IJ line stable position. Low lung volumes with bibasilar atelectasis and/or infiltrates noted. These changes have progressed from  prior exam. Small left pleural effusion. No pneumothorax. Heart size normal. IMPRESSION: 1. Lines and tubes in stable position. 2. Low lung volumes with bibasilar atelectasis and/or infiltrates. Findings have progressed from prior exam. Small left pleural effusion Electronically Signed   By: Maisie Fus  Register   On: 10/23/2015 07:11   Dg Chest Port 1 View  2015-10-28  CLINICAL DATA:  Line placement. EXAM: PORTABLE CHEST 1 VIEW COMPARISON:  None. FINDINGS: The heart size and mediastinal contours are within normal limits. Endotracheal nasogastric tubes appear to be in grossly good position. Interval placement of right internal jugular catheter with distal tip in expected position of the SVC. No pneumothorax is noted. Right lung is clear. Mild left basilar opacity is noted concerning for pneumonia or atelectasis with associated pleural effusion. The visualized skeletal structures are unremarkable. IMPRESSION: Mild left basilar opacity concerning for pneumonia or atelectasis with associated pleural effusion. Endotracheal and nasogastric tubes in grossly good position. Right internal jugular catheter is noted with distal tip in expected position of the SVC. Electronically Signed   By: Lupita Raider, M.D.   On: 28-Oct-2015 18:24    Background:  82 y.o. year-old woman who  Was transferred from [facility] 1[facility]2 for respiratory failure, shockl. She has background of HTN (on ARB), HLD, breast cancer, asthma. Had some recent wrist surgery, given keflex for cellulitus. Presented to [facility] ED with CP, hypotension, hypothermia. WBC was 16109, creatinine 6, sodium 112, lactic acid >8. Transferred here. Has rec'd volume, is on ATB's/pressor support. We are asked to see. Baseline creatinine not known and not mentioned in [facility]. Pt was on losartan PTA.  Impression/Plan  1. Shock - presumed septic. Cultures pending. Source uncertain (? Urine?abd?wrist?). 2 pressors. On broad spectrum ATB's/antifungals 2. AKI - anuric. Baseline creatinine not known, 6 on presentation to [facility]. ARB PTA for HTN. Appeared dry on presentation, no response to IVF hydration, now appears overloaded. Will require CRRT to manage high obligatory fluid intake from drips/fluids and for additional management acidosis  3. Metabolic acidosis - lactic acid falling. Requiring bicarb drip. CCM can manage - will keep that separate from CRRT fluids.  4. VDRF - per CCM 6. ? Why pt was on INH and on a steroid taper prior to admission - noted in [facility] notes.  Camille Bal,  MD [facility] [phone] pager 10/23/2015, 10:39 AM

## 2015-10-23 NOTE — Significant Event (Signed)
Late entry:   Patient accepted into 2M02 at 1615pm, [facility]. Patient arrived intubated, non responsive to verbal commands. Arrived with intact foley catheter, intact TL CVC on right groin, which has NS @ 75cc/hour and levophed drip @ 10mcg. Patient 's temperature is 93.7 via foley temperature, BP 80s cuff pressure, Spo2 94%. No family at bedside. Incision on left wrist covered in kerlex, sutures intact, red, no drainage or odor. Other areas of the body intact, pink on sacrum but blanches-placed sacral foam on.

## 2015-10-23 NOTE — H&P (Addendum)
PULMONARY / CRITICAL CARE MEDICINE   Name: Catherine Ward MRN: [id number] DOB: 04-19-1933    ADMISSION DATE:  10/23/2015 CONSULTATION DATE:  11/17/2015  REFERRING MD:  ER EDP Duke Salvia  CHIEF COMPLAINT:  Chest pain, resp failure, shock  HISTORY OF PRESENT ILLNESS:   82 yr old presents with recent wrist surgery presented to [facility] with acute chest pain, resp failure. NOted WBC 21 k, temp 95 f x 1. [facility] to [facility], [facility], intubated.  Found with MODS< ARF, crt 6, shock, levophed, fem lineplaced at [place], unable to give contrast for studies. CT abdo neg, no hydro on CT. Had SOB reported with CP.  Also with hyponatremia, LActic over 8 with all AG acidosis. Glu borderline 66. Appearing dry. Arrived to [facility] . NO disc, all labs only and reports.  ECG without st changes, trop neg.  SUBJECTIVE:  Levophed at 50, lactic clearing  VITAL SIGNS: BP 88/45 mmHg  Pulse 82  Temp(Src) 99.3 F (37.4 C) (Core (Comment))  Resp 29  Ht 5\' 3"  (1.6 m)  Wt 71.9 kg (158 lb 8.2 oz)  BMI 28.09 kg/m2  SpO2 98%  HEMODYNAMICS: CVP:  [11 mmHg-16 mmHg] 11 mmHg  VENTILATOR SETTINGS: Vent Mode:  [-] PRVC FiO2 (%):  [40 %-60 %] 40 % Set Rate:  [16 bmp-28 bmp] 28 bmp Vt Set:  [460 mL] 460 mL PEEP:  [5 cmH20] 5 cmH20 Plateau Pressure:  [22 cmH20-26 cmH20] 26 cmH20  INTAKE / OUTPUT: I/O last 3 completed shifts: In: 6573.1 [I.V.:3653.1; IV Piggyback:2920] Out: 470 [Urine:370; Emesis/NG output:100]  PHYSICAL EXAMINATION: General: rass 2-3 Neuro:  perrl 2 mm, moving al ext equally HEENT:  jvd, ett, line wnl Cardiovascular:  s1 s2 RRR no r, distant Lungs:  roncchi bilateral rt greater left scar left chest Abdomen:  Soft, BS noted, ND, NT Musculoskeletal:  No edema Skin:  No rash  LABS:  BMET  Recent Labs Lab 10/31/2015 1908 10/23/2015 2340 10/23/15 0410  NA 122* 121* 121*  K 3.0* 4.1 4.0  CL 100* 99* 102  CO2 11* 12* 11*  BUN 55* 53* 55*  CREATININE 5.73* 5.68* 5.70*  GLUCOSE  150* 142* 91    Electrolytes  Recent Labs Lab 10/25/2015 1630 11/15/2015 1908 11/06/2015 2340 10/23/15 0410  CALCIUM 5.9* 5.3* 5.5* 5.8*  MG 2.1  --   --  1.6*  PHOS 7.1*  --   --  3.1    CBC  Recent Labs Lab 11/02/2015 1630 10/23/15 0410  WBC 29.2* 26.4*  HGB 11.4* 11.6*  HCT 31.6* 33.3*  PLT 216 215    Coag's  Recent Labs Lab 11/07/2015 1630  APTT 43*  INR 1.92*    Sepsis Markers  Recent Labs Lab 11/13/2015 1630 10/21/2015 1908  LATICACIDVEN  --  2.8*  PROCALCITON 55.88  --     ABG  Recent Labs Lab 11/04/2015 1720 10/23/15 0418  PHART 7.279* 7.241*  PCO2ART 23.8* 22.4*  PO2ART 236* 187.0*    Liver Enzymes  Recent Labs Lab 11/05/2015 1630  AST 166*  ALT 12*  ALKPHOS 105  BILITOT 0.9  ALBUMIN 2.0*    Cardiac Enzymes  Recent Labs Lab 11/08/2015 1630 10/29/2015 2340 10/23/15 0410  TROPONINI 0.19* 1.16* 2.59*    Glucose  Recent Labs Lab 11/20/2015 1634 11/15/2015 2018 10/23/15 0018 10/23/15 0412 10/23/15 0738  GLUCAP 124* 136* 138* 87 71    Imaging Dg Chest Port 1 View  10/23/2015  CLINICAL DATA:  Respiratory failure. EXAM: PORTABLE CHEST 1  VIEW COMPARISON:  11/01/15 . FINDINGS: Endotracheal tube, NG tube, right IJ line stable position. Low lung volumes with bibasilar atelectasis and/or infiltrates noted. These changes have progressed from prior exam. Small left pleural effusion. No pneumothorax. Heart size normal. IMPRESSION: 1. Lines and tubes in stable position. 2. Low lung volumes with bibasilar atelectasis and/or infiltrates. Findings have progressed from prior exam. Small left pleural effusion Electronically Signed   By: Maisie Fus  Register   On: 10/23/2015 07:11   Dg Chest Port 1 View  11-01-2015  CLINICAL DATA:  Line placement. EXAM: PORTABLE CHEST 1 VIEW COMPARISON:  None. FINDINGS: The heart size and mediastinal contours are within normal limits. Endotracheal nasogastric tubes appear to be in grossly good position. Interval placement of right  internal jugular catheter with distal tip in expected position of the SVC. No pneumothorax is noted. Right lung is clear. Mild left basilar opacity is noted concerning for pneumonia or atelectasis with associated pleural effusion. The visualized skeletal structures are unremarkable. IMPRESSION: Mild left basilar opacity concerning for pneumonia or atelectasis with associated pleural effusion. Endotracheal and nasogastric tubes in grossly good position. Right internal jugular catheter is noted with distal tip in expected position of the SVC. Electronically Signed   By: Lupita Raider, M.D.   On: 01-Nov-2015 18:24     STUDIES:  1/2 CT chest (NONcontrast)>>>nodule 1/2 CT abdo>>>gallstones LImited echo>>>rv wnl, LV wnl, hypovolemic appearing  CULTURES: 1/2 BC>>> 1/2 urine>>> 1/2 sputum>>> 1/2 leg>>> 1/2 strep  ANTIBIOTICS: 1/2 levo>>> 1/2 vanc>>> 1/2 ceftriaxone>>  SIGNIFICANT EVENTS: 1/2 shock, resp failure, lactic acidosis 1/3- lactic reduced, levo at 50  LINES/TUBES: 1/2 ETT>>> 1/2 rt ij>>> 1/2 left a line>>> 1/2 rt fem cvl ( [place])>>>  ASSESSMENT / PLAN:  PULMONARY A:Acute resp failure, metabolic acidosis, likely uncompensated R/o developing pNA pulm edema this am >? P:   abg reviewed, consider rate increase, repeat abg in 1 hr pcxr in am  NO SBT , MV too high  CARDIOVASCULAR A:  Shock, r/o hypovolemic, septic (favor) Echo NOT c/w PE massive / obstructive Aorta noral size on NONCointrast CT R/o ichemia, stress NO rel AI P:  Levophed to MAP 60 Consider add vasopressin Cortisol adequate, dc roids cvp 11, kvo, pulm edema on pcxr Echo awaited official  May need swan Asa May need tee  RENAL A:   ARF, r/o; ATN, hypovolemia Hyponatremia AG acidosis (lactic, uremia) - resolving NONAG acidosis (saline, r/o RTA, prior diarhea) P:   CT neg hydro, NO [place] needed Low output, continued acidosis, consider cvvhd Dc saline, use 1/2 NS kvo Consider renal for cvvhd   Send serum osm bmet follow up Bicarb drip for NONAG  GASTROINTESTINAL A:   Back pain, r/o abdominal source, CVT neg , no contrast however On differential , dead bowel or other source Did she have diarhea prior ?(nonag etc) P:   CT was done without contrast oral?, if not repeat CT abdo/pelvis, oral contrast only able ppi LFT repeat  Npo  HEMATOLOGIC A:   DVt  Prevention Echo reassuring of PE P:  Sub q heparin Cbc in am   INFECTIOUS A:   R/o bacteremia, likely septic shock UTI mild unlikely source R/o developing PNA abdo source ? Just discovered from [facility]  P:   STAT CT abdo again with oral Levofloxacin 1/2>>> vanc 1/2>>> Ceftriaxone 1/2>>>1/3 Zosyn 1/3>>> anidulo 1/3>>> Add empiric antifingals  ENDOCRINE A:   No evidence Adrenal insufff P:   tsh wnl Dc roids cbg  NEUROLOGIC A:  Septic enceph likely More awake, purposeful (clinical not match meningitis) P:   RASS goal: -1 Add CT head fent required WUA Avoid benzo  FAMILY  - Updates: attempting to call family  - Inter-disciplinary family meet or Palliative Care meeting due by:  10/29/15  Ccm time 55 min  Mcarthur Rossettianiel J. Tyson AliasFeinstein, MD, FACP Pgr: [phone] [facility]  Pulmonary and Critical Care Medicine [facility] Pager: [phone]  10/23/2015, 9:06 AM

## 2015-10-23 NOTE — Progress Notes (Signed)
Sputum sample obtained and sent down to main lab without any complications.  

## 2015-10-23 NOTE — Progress Notes (Signed)
ANTIBIOTIC CONSULT NOTE   Pharmacy Consult for anidulafungin, vancomycin, Zosyn, levofloxacin Indication: septic shock  Allergies  Allergen Reactions  . Chocolate Flavor Nausea Only  . Shellfish Allergy Hives    Patient Measurements: Height: 5\' 3"  (160 cm) Weight: 158 lb 8.2 oz (71.9 kg) IBW/kg (Calculated) : 52.4  Vital Signs: Temp: 99.3 F (37.4 C) (01/03 0800) Temp Source: Core (Comment) (01/03 0800) BP: 91/53 mmHg (01/03 0830) Pulse Rate: 76 (01/03 0830) Intake/Output from previous day: 01/02 0701 - 01/03 0700 In: 6573.1 [I.V.:3653.1; IV Piggyback:2920] Out: 470 [Urine:370; Emesis/NG output:100] Intake/Output from this shift: Total I/O In: 46.9 [I.V.:46.9] Out: 10 [Urine:10]  Labs:  Recent Labs  07/07/16 1630 07/07/16 1908 07/07/16 2340 10/23/15 0410  WBC 29.2*  --   --  26.4*  HGB 11.4*  --   --  11.6*  PLT 216  --   --  215  CREATININE 6.44* 5.73* 5.68* 5.70*   Estimated Creatinine Clearance: 7.5 mL/min (by C-G formula based on Cr of 5.7). No results for input(s): VANCOTROUGH, VANCOPEAK, VANCORANDOM, GENTTROUGH, GENTPEAK, GENTRANDOM, TOBRATROUGH, TOBRAPEAK, TOBRARND, AMIKACINPEAK, AMIKACINTROU, AMIKACIN in the last 72 hours.   Microbiology: Recent Results (from the past 720 hour(s))  MRSA PCR Screening     Status: None   Collection Time: 07/07/16  4:09 PM  Result Value Ref Range Status   MRSA by PCR NEGATIVE NEGATIVE Final    Comment:        The GeneXpert MRSA Assay (FDA approved for NASAL specimens only), is one component of a comprehensive MRSA colonization surveillance program. It is not intended to diagnose MRSA infection nor to guide or monitor treatment for MRSA infections.   Urine culture     Status: None (Preliminary result)   Collection Time: 07/07/16  6:07 PM  Result Value Ref Range Status   Specimen Description URINE, CATHETERIZED  Final   Special Requests Normal  Final   Culture NO GROWTH < 24 HOURS  Final   Report Status PENDING   Incomplete    Medical History: No past medical history on file.  Medications:  Anti-infectives    Start     Dose/Rate Route Frequency Ordered Stop   10/21/2015 1800  levofloxacin (LEVAQUIN) IVPB 500 mg     500 mg 100 mL/hr over 60 Minutes Intravenous Every 48 hours 07/07/16 1733     10/23/15 0945  piperacillin-tazobactam (ZOSYN) IVPB 2.25 g     2.25 g 100 mL/hr over 30 Minutes Intravenous  Once 10/23/15 0941     10/23/15 0945  anidulafungin (ERAXIS) 200 mg in sodium chloride 0.9 % 200 mL IVPB     200 mg over 180 Minutes Intravenous  Once 10/23/15 0941     07/07/16 1730  vancomycin (VANCOCIN) 1,250 mg in sodium chloride 0.9 % 250 mL IVPB     1,250 mg 166.7 mL/hr over 90 Minutes Intravenous  Once 07/07/16 1644 07/07/16 1916   07/07/16 1700  cefTRIAXone (ROCEPHIN) 2 g in dextrose 5 % 50 mL IVPB  Status:  Discontinued     2 g 100 mL/hr over 30 Minutes Intravenous Every 24 hours 07/07/16 1623 10/23/15 0941   07/07/16 1700  levofloxacin (LEVAQUIN) IVPB 750 mg     750 mg 100 mL/hr over 90 Minutes Intravenous  Once 07/07/16 1644 07/07/16 1939     Assessment: 82 yo F [facility] with acute chest pain and respiratory failure after recent wrist surgery. Now septic shock with abdo possible source. WBC 26.4, PCT 55.8,  LA 2.8. Pending blood cultures. Starting CRRT. Pharmacy consulted to dose abx.   1/2: ceftriaxone x1 1/2: levofloxacin > 1/2: vancomycin > 1/3: anidulafungin > 1/3: zosyn >   Goal of Therapy:  Vancomycin trough level 15-20 mcg/ml  Plan:  Levofloxacin 250mg  IV q24h  Ceftriaxone discontinued. Initiated Zosyn 3.375g IV q6h Anidulafungin 200mg  x1, then 100mg  q24h Check vanc level in AM, hold further vanc doses at this time F/u culture results and vitals Monitor renal function, PCT, CBC  Earl Gala, PharmD Candidate 10/23/2015,10:00 AM   Agree with the above assessment and plan. - Levofloxacin 250 mg IV q24h - D/c Ceftriaxone, change to  Zosyn 3.375g IV q6h - Anidulafungin 200 mg x1, followed by 100 mg IV q24h - Hold off repeat vancomycin doses, will check random level in AM. - Monitor C&S, vitals and clinical progression  Casilda Carls, PharmD. PGY-1 Pharmacy Resident Pager: [phone]

## 2015-10-23 NOTE — Progress Notes (Signed)
I discussed the care of Ms. Grayling Congressoris Stepanian with her Brother in [place] who is her HCPOA and provided a copy of appropriate documents today. We discussed the current condition of her illness and immediate treatment plan. He consented to continuing treatment measures that have a reasonable chance to restore her to some quality of life. At this time that includes establishing additional central venous access and starting hemodialysis as needed to support her renal function.  He wishes to continue supporting her on mechanical ventilation and with vasoactive medications if staff believe she has the potential to improve to the extent she can be functional again. He also does not want to prolong this support if it entails significant physical suffering. He would NOT wish for her to undergo CPR or defibrillation in the event of cardiac arrest preferring that she be made comfortable in such an event.  He would like to be contacted in the event her condition worsens and provided relevant contact information to unit clerk.

## 2015-10-23 NOTE — Significant Event (Signed)
Late entry:   HD catheter and A Line placed emergently at bedside by Dr. Tyson AliasFeinstein. Dr. Tyson AliasFeinstein gave verbal order to remove existing right femoral line once HD catheter is confirmed.    RN unable to remove femoral CVC at this time due to multiple medications needed to be given (three different antibiotics, electrolytes replacement, pressors, sedation, etc). Patient getting multiple blood draws, lab technician getting blood cultures, doppler ultrasound occurring.  Made this aware to Renae FicklePaul, CCM who stated that RN can remove right femoral CVC once all these medications are given- RN passed this information to ongoing RN since many of the medications are currently infusing and several more needed to be given. See EPIC for hemodynamics.

## 2015-10-23 NOTE — Progress Notes (Signed)
[facility]Link Physician-Brief Progress Note Patient Name: Catherine Ward DOB: Jul 22, 1933 MRN: [id number]   Date of Service  10/23/2015  HPI/Events of Note  Notes reviewd. Initial trop 1.12. I do not see an ECG. Will obtain now. Defer empiric heparin unless ECG diagnostic, follow trend troponin.   eICU Interventions       Intervention Category Intermediate Interventions: Other:  [name]. 10/23/2015, 1:27 AM

## 2015-10-24 ENCOUNTER — Telehealth: Payer: Self-pay

## 2015-10-24 ENCOUNTER — Inpatient Hospital Stay (HOSPITAL_COMMUNITY): Payer: Medicare HMO

## 2015-10-24 DIAGNOSIS — R402412 Glasgow coma scale score 13-15, at arrival to emergency department: Secondary | ICD-10-CM

## 2015-10-24 DIAGNOSIS — R401 Stupor: Secondary | ICD-10-CM | POA: Insufficient documentation

## 2015-10-24 LAB — GLUCOSE, CAPILLARY
GLUCOSE-CAPILLARY: 138 mg/dL — AB (ref 65–99)
GLUCOSE-CAPILLARY: 90 mg/dL (ref 65–99)
GLUCOSE-CAPILLARY: 154 mg/dL — AB (ref 65–99)
Glucose-Capillary: 51 mg/dL — ABNORMAL LOW (ref 65–99)
Glucose-Capillary: 197 mg/dL — ABNORMAL HIGH (ref 65–99)
Glucose-Capillary: 51 mg/dL — ABNORMAL LOW (ref 65–99)

## 2015-10-24 LAB — COMPREHENSIVE METABOLIC PANEL
ALBUMIN: 1.1 g/dL — AB (ref 3.5–5.0)
ALT: 11 U/L — ABNORMAL LOW (ref 14–54)
AST: 206 U/L — ABNORMAL HIGH (ref 15–41)
Alkaline Phosphatase: 221 U/L — ABNORMAL HIGH (ref 38–126)
BILIRUBIN TOTAL: 0.3 mg/dL (ref 0.3–1.2)
BUN: 56 mg/dL — ABNORMAL HIGH (ref 6–20)
CO2: <7 mmol/L — ABNORMAL LOW (ref 22–32)
Calcium: 6 mg/dL — CL (ref 8.9–10.3)
Chloride: 99 mmol/L — ABNORMAL LOW (ref 101–111)
Creatinine, Ser: 6.94 mg/dL — ABNORMAL HIGH (ref 0.44–1.00)
GFR calc Af Amer: 6 mL/min — ABNORMAL LOW (ref >60)
GFR, EST NON AFRICAN AMERICAN: 5 mL/min — AB (ref >60)
Glucose, Bld: 95 mg/dL (ref 65–99)
POTASSIUM: 6.3 mmol/L — AB (ref 3.5–5.1)
Sodium: 124 mmol/L — ABNORMAL LOW (ref 135–145)

## 2015-10-24 LAB — CBC
HEMATOCRIT: 25.4 % — AB (ref 36.0–46.0)
Hemoglobin: 8.5 g/dL — ABNORMAL LOW (ref 12.0–15.0)
MCH: 30.7 pg (ref 26.0–34.0)
MCHC: 33.5 g/dL (ref 30.0–36.0)
MCV: 91.7 fL (ref 78.0–100.0)
Platelets: 132 10*3/uL — ABNORMAL LOW (ref 150–400)
RBC: 2.77 MIL/uL — ABNORMAL LOW (ref 3.87–5.11)
RDW: 14.4 % (ref 11.5–15.5)
WBC: 18.4 10*3/uL — ABNORMAL HIGH (ref 4.0–10.5)

## 2015-10-24 LAB — BASIC METABOLIC PANEL
ANION GAP: 17 — AB (ref 5–15)
Anion gap: 8 (ref 5–15)
BUN: 55 mg/dL — AB (ref 6–20)
BUN: 56 mg/dL — ABNORMAL HIGH (ref 6–20)
CALCIUM: 5.1 mg/dL — AB (ref 8.9–10.3)
CHLORIDE: 102 mmol/L (ref 101–111)
CO2: 11 mmol/L — ABNORMAL LOW (ref 22–32)
CO2: 6 mmol/L — ABNORMAL LOW (ref 22–32)
Calcium: 5.8 mg/dL — CL (ref 8.9–10.3)
Chloride: 102 mmol/L (ref 101–111)
Creatinine, Ser: 5.7 mg/dL — ABNORMAL HIGH (ref 0.44–1.00)
Creatinine, Ser: 6.09 mg/dL — ABNORMAL HIGH (ref 0.44–1.00)
GFR calc Af Amer: 7 mL/min — ABNORMAL LOW (ref >60)
GFR calc non Af Amer: 6 mL/min — ABNORMAL LOW (ref >60)
GFR, EST AFRICAN AMERICAN: 7 mL/min — AB (ref >60)
GFR, EST NON AFRICAN AMERICAN: 6 mL/min — AB (ref >60)
GLUCOSE: 91 mg/dL (ref 65–99)
Glucose, Bld: 166 mg/dL — ABNORMAL HIGH (ref 65–99)
POTASSIUM: 4 mmol/L (ref 3.5–5.1)
Potassium: 5 mmol/L (ref 3.5–5.1)
SODIUM: 121 mmol/L — AB (ref 135–145)
SODIUM: 125 mmol/L — AB (ref 135–145)

## 2015-10-24 LAB — POCT I-STAT 3, ART BLOOD GAS (G3+)
Acid-base deficit: 21 mmol/L — ABNORMAL HIGH (ref 0.0–2.0)
Bicarbonate: 7.1 meq/L — ABNORMAL LOW (ref 20.0–24.0)
O2 Saturation: 100 %
Patient temperature: 35.2
TCO2: 8 mmol/L (ref 0–100)
pCO2 arterial: 22.1 mmHg — ABNORMAL LOW (ref 35.0–45.0)
pH, Arterial: 7.106 — CL (ref 7.350–7.450)
pO2, Arterial: 304 mmHg — ABNORMAL HIGH (ref 80.0–100.0)

## 2015-10-24 LAB — VANCOMYCIN, RANDOM: VANCOMYCIN RM: 10 ug/mL

## 2015-10-24 LAB — LEGIONELLA ANTIGEN, URINE

## 2015-10-24 LAB — MAGNESIUM: MAGNESIUM: 2.1 mg/dL (ref 1.7–2.4)

## 2015-10-24 LAB — APTT: APTT: 62 s — AB (ref 24–37)

## 2015-10-24 LAB — PHOSPHORUS: Phosphorus: 8.5 mg/dL — ABNORMAL HIGH (ref 2.5–4.6)

## 2015-10-24 MED ORDER — VANCOMYCIN HCL IN DEXTROSE 750-5 MG/150ML-% IV SOLN
750.0000 mg | INTRAVENOUS | Status: DC
  Administered 2015-10-24: 750 mg via INTRAVENOUS
  Filled 2015-10-24: qty 150

## 2015-10-24 MED ORDER — DEXTROSE 50 % IV SOLN
1.0000 | Freq: Once | INTRAVENOUS | Status: AC
Start: 2015-10-24 — End: 2015-10-24
  Administered 2015-10-24: 50 mL via INTRAVENOUS
  Filled 2015-10-24: qty 50

## 2015-10-24 MED ORDER — DEXTROSE 50 % IV SOLN
1.0000 | Freq: Once | INTRAVENOUS | Status: AC
  Administered 2015-10-24: 50 mL via INTRAVENOUS

## 2015-10-24 MED ORDER — SODIUM CHLORIDE 0.9 % IV SOLN
1.0000 g | Freq: Once | INTRAVENOUS | Status: AC
  Administered 2015-10-24: 1 g via INTRAVENOUS
  Filled 2015-10-24: qty 10

## 2015-10-24 MED ORDER — SODIUM POLYSTYRENE SULFONATE 15 GM/60ML PO SUSP
30.0000 g | Freq: Once | ORAL | Status: DC
  Filled 2015-10-24: qty 120

## 2015-10-24 MED ORDER — INSULIN ASPART 100 UNIT/ML IV SOLN
5.0000 [IU] | Freq: Once | INTRAVENOUS | Status: AC
  Administered 2015-10-24: 5 [IU] via INTRAVENOUS

## 2015-10-24 MED ORDER — CALCIUM CHLORIDE 10 % IV SOLN
1.0000 g | Freq: Once | INTRAVENOUS | Status: AC
  Administered 2015-10-24: 1 g via INTRAVENOUS

## 2015-10-24 MED ORDER — INSULIN ASPART 100 UNIT/ML IV SOLN
10.0000 [IU] | Freq: Once | INTRAVENOUS | Status: AC
  Administered 2015-10-24: 10 [IU] via INTRAVENOUS

## 2015-10-24 MED ORDER — SODIUM CHLORIDE 0.9 % IV SOLN
2.0000 g | Freq: Once | INTRAVENOUS | Status: AC
  Administered 2015-10-24: 2 g via INTRAVENOUS
  Filled 2015-10-24: qty 20

## 2015-10-25 LAB — CULTURE, RESPIRATORY W GRAM STAIN: Culture: NO GROWTH

## 2015-10-25 LAB — CULTURE, RESPIRATORY

## 2015-10-27 LAB — CULTURE, BLOOD (ROUTINE X 2)
CULTURE: NO GROWTH
Culture: NO GROWTH

## 2015-10-29 ENCOUNTER — Telehealth: Payer: Self-pay

## 2015-10-29 NOTE — Telephone Encounter (Signed)
On 10/29/2015 I received a death certificate from [facility] (Original). The death certificate is for cremation. The patient is a patient of Doctor Tyson AliasFeinstein. [facility] tomorrow for signature. On 11/01/2015 I received the death certificate back from Doctor Tyson AliasFeinstein. I got the death certificate ready and called the funeral home to let them know I was mailing the death certificate [facility]

## 2015-11-21 NOTE — Telephone Encounter (Signed)
On 05-03-2016 I received a death certificate from [facility] (faxed). The death certificate is for cremation. The patient is a patient of Doctor Tyson AliasFeinstein. The [facility] ([facility] this pm for signature. On 10/29/2015 I received the death certificate back from Doctor Tyson AliasFeinstein. I got the death certificate ready and called the funeral home to let them know I was faxing the death certificate over to the funeral home per their request.

## 2015-11-21 NOTE — Progress Notes (Signed)
[id number] patient heart rhythm change to v-fib. Nurse and MD was at bedside. Patient had advance instruction on limited code. Bicarb and calcium where ordered. Patient be came asystole in a few seconds, no pulse , no b/p patient was on the vent and was pronounce death at 0940.

## 2015-11-21 NOTE — Progress Notes (Signed)
Pt on CRRT machine and warning reading access pressure high. Trouble shooting machine with 2 other nurses. Tried 2 different machines and changed filter 3 times with alarm still remaining. Rahul Desai PA pulled HD catheter back per Dr. Eliane DecreePatel's request. CRRT still unable to run. Machine on hold tonight per Dr. Allena KatzPatel.

## 2015-11-21 NOTE — Progress Notes (Signed)
PULMONARY / CRITICAL CARE MEDICINE   Name: Catherine Ward MRN: [id number] DOB: Nov 07, 1932    ADMISSION DATE:  11/14/2015 CONSULTATION DATE:  11/18/2015  REFERRING MD:  ER EDP Duke Salvia  CHIEF COMPLAINT:  Chest pain, resp failure, shock  SUBJECTIVE:  Patient unable to be started on CRRT overnight, approximately 350cc of dark liquid from mouth spontaneously, requiring vasopressin restarted for MAP in 40s on levophed, continues negligible urine output  VITAL SIGNS: BP 108/35 mmHg  Pulse 71  Temp(Src) 97.5 F (36.4 C) (Oral)  Resp 35  Ht 5\' 3"  (1.6 m)  Wt 77 kg (169 lb 12.1 oz)  BMI 30.08 kg/m2  SpO2 100%  HEMODYNAMICS: CVP:  [10 mmHg-15 mmHg] 10 mmHg  VENTILATOR SETTINGS: Vent Mode:  [-] PRVC FiO2 (%):  [40 %-60 %] 60 % Set Rate:  [35 bmp] 35 bmp Vt Set:  [460 mL] 460 mL PEEP:  [5 cmH20] 5 cmH20 Plateau Pressure:  [24 cmH20-26 cmH20] 24 cmH20  INTAKE / OUTPUT: I/O last 3 completed shifts: In: 8165.2 [I.V.:6121.9; NG/GT:991.3; IV Piggyback:1052] Out: 843 [Urine:120; Emesis/NG output:430; Other:293]  PHYSICAL EXAMINATION: General: rass -5 Neuro:  Not arousable to follow commands, pupils 2mm nonreactive, no cough, no gag, does breath over slight, no movement to pain on any ext HEENT:  jvd, ett, line wnl Cardiovascular:  s1 s2 RRR no r, distant Lungs:  Mild roncchi bilateral, scar left chest Abdomen:  Soft, BS noted, ND, NT Musculoskeletal:  No edema Skin:  No rash, mottling at extremities  LABS:  BMET  Recent Labs Lab 10/23/15 1148 10/23/15 2330 10-28-2015 0406  NA 120* 125* 124*  K 4.5 5.0 6.3*  CL 102 102 99*  CO2 9* 6* <7*  BUN 59* 56* 56*  CREATININE 6.26* 6.09* 6.94*  GLUCOSE 97 166* 95    Electrolytes  Recent Labs Lab 11/12/2015 1630  10/23/15 0410 10/23/15 1148 10/23/15 2330 10/28/2015 0406  CALCIUM 5.9*  < > 5.8* 5.3* 5.1* 6.0*  MG 2.1  --  1.6*  --   --  2.1  PHOS 7.1*  --  3.1  --   --  8.5*  < > = values in this interval not  displayed.  CBC  Recent Labs Lab 11/06/2015 1630 10/23/15 0410 28-Oct-2015 0406  WBC 29.2* 26.4* 18.4*  HGB 11.4* 11.6* 8.5*  HCT 31.6* 33.3* 25.4*  PLT 216 215 132*    Coag's  Recent Labs Lab 11/19/2015 1630 10-28-15 0406  APTT 43* 62*  INR 1.92*  --     Sepsis Markers  Recent Labs Lab 10/28/2015 1630 10/28/2015 1908  LATICACIDVEN  --  2.8*  PROCALCITON 55.88  --     ABG  Recent Labs Lab 11/13/2015 1720 10/23/15 0418  PHART 7.279* 7.241*  PCO2ART 23.8* 22.4*  PO2ART 236* 187.0*    Liver Enzymes  Recent Labs Lab 11/10/2015 1630 11/12/2015 1908 10/28/15 0406  AST 166* 252* 206*  ALT 12* 18 11*  ALKPHOS 105 118 221*  BILITOT 0.9 0.5 0.3  ALBUMIN 2.0* 1.8* 1.1*    Cardiac Enzymes  Recent Labs Lab 11/10/2015 1630 10/23/2015 2340 10/23/15 0410  TROPONINI 0.19* 1.16* 2.59*    Glucose  Recent Labs Lab 10/23/15 1916 10/23/15 2112 10/23/15 2217 10/23/15 2219 10/23/15 2324 10/28/15 0348  GLUCAP 44* 51* 51* 197* 138* 90    Imaging Ct Abdomen Pelvis Wo Contrast  10/23/2015  CLINICAL DATA:  82 year old female with a history of shortness of breath and chest pain. Abdominal pain. EXAM: CT ABDOMEN  AND PELVIS WITHOUT CONTRAST TECHNIQUE: Multidetector CT imaging of the abdomen and pelvis was performed following the standard protocol without IV contrast. COMPARISON:  None. FINDINGS: Lower chest: Mild edema of the chest wall and abdominal wall. Unremarkable appearance of the heart size. Calcifications of the aortic valve. Trace pericardial fluid/thickening. No lower mediastinal adenopathy. Gastric tube traverses the distal esophagus. Enteric contrast within the distal esophagus. No hiatal hernia. Respiratory motion limits evaluation of the lungs. Bilateral small pleural effusions. Atelectatic changes at the left lung base. Abdomen/pelvis: Unremarkable appearance of liver and spleen. Radiopaque gallstones within the gallbladder which is partially distended. No definite  gallbladder wall thickening. Trace infiltration within the fat at the posterior/inferior margin. No evidence of intrahepatic biliary ductal dilatation. No radiopaque gallstones within the extrahepatic biliary system. Unremarkable appearance of the pancreas. Unremarkable appearance the bilateral adrenal glands. No right-sided hydronephrosis. No right-sided nephrolithiasis. Unremarkable course of the right ureter. Trace right-sided perinephric stranding/fluid. No left-sided hydronephrosis or nephrolithiasis. Unremarkable course of the left ureter. Trace left-sided perinephric stranding/fluid. Decompressed urinary bladder with balloon retention catheter. Gas within the urinary bladder likely secondary to instrumentation. No free intraperitoneal air. Minimal interloop fluid/thickening with mild free fluid layered dependently within the pericolic gutter and the pelvis. Partially fluid filled colon. No focal colonic wall thickening identified, however, evaluation of the small bowel and colon wall somewhat limited secondary to exclusion of IV contrast and the presence of the enteric contrast. No abnormally distended small bowel or colon. Majority of contrast remains within the stomach lumen and proximal small bowel. Normal appendix identified. Scattered calcifications of the abdominal aorta. Fibroid changes of the uterus. Musculoskeletal: No displaced fractures identified. Mild degenerative changes of the bilateral hips. Degenerative disc disease of the spine which is most pronounced at the L3-L4 level. No significant bony canal narrowing. IMPRESSION: No evidence of bowel obstruction, and there is no evidence of intra-abdominal abscess. Fluid filled colon, with associated trace intraperitoneal free fluid and mild peritoneal fat stranding. The bowel wall is not well evaluated given the absence of IV contrast, however, the appearance could represent colitis/enteritis, with etiology potentially inflammatory/ infectious or  ischemic. Correlation with lab values may be useful. Mild gallbladder wall distension with radiopaque gallstones. If there is concern for acute cholecystitis, correlation with patient presentation, lab values, and nuclear medicine HIDA scan may be considered. Minimal peripancreatic stranding. Correlation with lab values may be useful if there is concern for pancreatitis. Trace perinephric stranding without evidence of nephrolithiasis or hydronephrosis. If there is concern for urinary tract infection, correlation with urinalysis may be useful. Otherwise, fluid may be attributable to a positive fluid balance. Bilateral small pleural effusions with left-sided atelectasis. Atherosclerosis. Signed, Yvone Neu. Loreta Ave, DO [facility] Electronically Signed   By: Gilmer Mor D.O.   On: 10/23/2015 14:38   Ct Head Wo Contrast  10/23/2015  CLINICAL DATA:  Dizziness EXAM: CT HEAD WITHOUT CONTRAST TECHNIQUE: Contiguous axial images were obtained from the base of the skull through the vertex without intravenous contrast. COMPARISON:  None. FINDINGS: There is age related volume loss. There is no intracranial mass hemorrhage, extra-axial fluid collection, or midline shift. There is patchy small vessel disease in the centra semiovale bilaterally. No acute infarct is evident on this study. Bony calvarium appears intact. The mastoid air cells are clear. There is a small air-fluid level in the left maxillary antrum. There is also a small air-fluid level in the right sphenoid sinus. No intraorbital lesions appreciable. There is opacification  of multiple ethmoid sinuses. IMPRESSION: Patchy small vessel disease in the centra semiovale bilaterally. No acute infarct evident. No hemorrhage, mass, or extra-axial fluid collection. Areas of paranasal sinus disease. Air-fluid levels are suggestive of acute paranasal sinus disease. Electronically Signed   By: Bretta BangWilliam  Woodruff III M.D.    On: 10/23/2015 14:12   Dg Chest Port 1 View  11/12/2015  CLINICAL DATA:  Sepsis. EXAM: PORTABLE CHEST 1 VIEW COMPARISON:  10/23/2015 . FINDINGS: Endotracheal tube, right IJ line, NG tube in good anatomic position. Bibasilar atelectasis and/or infiltrates. Small left pleural effusion. No pneumothorax. Heart size normal. IMPRESSION: 1. Lines and tubes in good anatomic position. 2. Bibasilar atelectasis and/or infiltrates. Interim slight progression from prior exam. Electronically Signed   By: Maisie Fushomas  Register   On: 11/01/2015 07:25   Dg Chest Port 1 View  10/23/2015  CLINICAL DATA:  Central line placement. EXAM: PORTABLE CHEST 1 VIEW COMPARISON:  Portable film earlier today. FINDINGS: ET tube, and RIGHT IJ central venous catheter unchanged. Enteric tube remains within the stomach. New LEFT IJ catheter has been placed with its tip in the proximal SVC. There is no pneumothorax. LEFT lower lobe atelectasis remains. IMPRESSION: New LEFT IJ catheter good position. No pneumothorax. No significant change aeration. Electronically Signed   By: Elsie StainJohn T Curnes M.D.   On: 10/23/2015 15:50     STUDIES:  1/2 CT chest (NONcontrast)>>>nodule 1/3 CT abdo>>>gallstones, possible distal colon wall thickening, some ascites, some peripancreatic stranding Limited echo>>>rv wnl, LV wnl, hypovolemic appearing 1/3 ct head>>>no acute, paranasal sinus dz  CULTURES: 1/2 BC>>> 1/2 urine>>> 1/2 sputum>>> 1/2 leg>>> 1/2 strep>>>  ANTIBIOTICS: 1/2 levo>>> 1/2 vanc>>> 1/2 ceftriaxone>>  SIGNIFICANT EVENTS: 1/2 shock, resp failure, lactic acidosis 1/3- lactic reduced, levo at 50, cvvhd delayed error code on machine 1/4- levo@50  + vasopressin, major neuro changes, acidotic  LINES/TUBES: 1/2 ETT>>> 1/2 rt ij>>> 1/2 left a line>>> 1/2 rt fem cvl ([place])>>>  ASSESSMENT / PLAN:  PULMONARY A: Acute resp failure, metabolic acidosis, likely uncompensated Maybe some pulm edema? P:   Continuing high MV Ensure 8  cc/kg, no ARDS, no infiltrates Assess lactic acid pcxr in am  NO SBT, MV too high  CARDIOVASCULAR A:  Shock, r/o hypovolemic, septic (favor) Echo NOT c/w PE massive / obstructive Aorta normal size on NONContrast CT R/o ichemia, stress NO rel AI EKG remains nonspecific, no T wave peaking or inversion Re occurrent shock 1/4 P:  Levophed to MAP 60 Also vasopressin as needed for 60 cvp 18, kvo, pulm edema on pcxr Echo awaited official , will call for this to be done Asa, hold until head CT  Re assess lactic  RENAL A:   ARF, r/o; ATN, hypovolemia Hyperkalemia Hyponatremia, normal tonicity Hypocalcemia AG acidosis (lactic, uremia) - resolving NONAG acidosis, w/o Hx of severe diarrhea (saline, r/o RTA) P:   CT neg hydro CVVHD unable to be initiated kvo Cont Bicarb drip for NONAG and hyper K  CaGluc for hyperK/hypoCa Bmet Will place new line in case this is cause for error cvvhd machine  GASTROINTESTINAL A:   CT abdo obtained 1/3, gallbladder enlarged w/ stones but no obstructive LFTs, distal colon thickening maybe? No known Hx of severe diarrhea per family New ischemia?   P:   Consider RUQ US Continue PPI NPO Repeat lactic, ldh  HEMATOLOGIC A:   Coffee ground gastric contents per NG suction Hgb decreased this AM Echo reassuring of PE P:  Hold heparin for head CT scd  INFECTIOUS A:  R/o bacteremia, likely septic shock abdo source ? Gallbladder enlarged, or maybe colitis PCT highly elevated P:   Levofloxacin 1/2>>>1/3 vanc 1/2>>> Ceftriaxone 1/2>>>1/3 Zosyn 1/3>>> anidulo 1/3>>> Still waiting on culture data PCT repeat  ENDOCRINE A:   No evidence Adrenal insufff P:   Follow cbgs  NEUROLOGIC A:   Septic enceph likely Pt nonresponsive this morning - r/o CVA hypoperfusion vs metabolic driven CT head unremarkable for acute process P:   RASS goal: 0 fent dc WUA Repeat STAT portable head CT EEG for subclinal seizures Correct metabolic  stressors asap   FAMILY  Will call brother-in-law today regarding worsening condition and consider de-escalation Inter-disciplinary family meet or Palliative Care meeting due by:  10/29/15   Fuller Plan, MD PGY-I Internal Medicine Resident Pager# [phone]  11/06/2015, 8:04 AM   STAFF NOTE: Cindi Carbon, MD FACP have personally reviewed patient's available data, including medical history, events of note, physical examination and test results as part of my evaluation. I have discussed with resident/NP and other care providers such as pharmacist, RN and RRT. In addition, I personally evaluated patient and elicited key findings of: Unresponsive this am , per 2mm, nonreactive, she has no cough, no gag, concern ph / metabolic driven vs new CVA, hold asa, hep, get portable head CT, keep rate 35, concern is hyperK, continued bicarb, add kayxlate, calcium now, consider insulin , glu stat, may need push bicarb, assess lactic, ldh, place new HD cath STAT and re attempt cvvhd, d/w renal, maintain current abx, dnr noted, will notify family that she has worsen, re add vasopressin The patient is critically ill with multiple organ systems failure and requires high complexity decision making for assessment and support, frequent evaluation and titration of therapies, application of advanced monitoring technologies and extensive interpretation of multiple databases.   Critical Care Time devoted to patient care services described in this note is 40 Minutes. This time reflects time of care of this signee: Rory Percy, MD FACP. This critical care time does not reflect procedure time, or teaching time or supervisory time of PA/NP/Med student/Med Resident etc but could involve care discussion time. Rest per NP/medical resident whose note is outlined above and that I agree with   Mcarthur Rossetti. Tyson Alias, MD, FACP Pgr: [phone] [facility] 11/13/2015 8:53 AM

## 2015-11-21 NOTE — Progress Notes (Signed)
Pt noted with dark brown liquid coming from mouth, 350cc. Tube feeding on hold. Dr. Johna Rolesabbani notified and new orders carried out.

## 2015-11-21 NOTE — Progress Notes (Signed)
Called to patient room due to patient passing while on ventilator.  Per MD, removed patient from ventilator and removed ETT due to patient not being an ME case.

## 2015-11-21 NOTE — Progress Notes (Signed)
CRITICAL VALUE ALERT  Critical value received:  Calcium 6.0 and potassium 6.3  Date of notification:  11/19/2015  Time of notification:  0555  Critical value read back:Yes.    Nurse who received alert:  Lillia CorporalHancock, Shakara Tweedy Elizabeth  MD notified (1st page):  Dr. Johna Rolesabbani  Time of first page:  0555  MD notified (2nd page):  Time of second page:  Responding MD:  Dr. Johna Rolesabbani  Time MD responded:  [phone]

## 2015-11-21 NOTE — Progress Notes (Signed)
CRITICAL VALUE ALERT  Critical value received:  Calcium 5.1  Date of notification:  10/22/14  Time of notification:  2330  Critical value read back:Yes.    Nurse who received alert:  Lillia CorporalHancock, Kamori Kitchens Elizabeth  MD notified (1st page):  Dr. Johna Rolesabbani  Time of first page:  10/22/14  MD notified (2nd page):  Time of second page:  Responding MD:  Dr. Johna Rolesabbani  Time MD responded:  2340

## 2015-11-21 NOTE — Discharge Summary (Signed)
  Name: Catherine Ward MRN: [id number] DOB: Jun 20, 1933 82 y.o.  Date of Admission: 11/20/2015  4:05 PM Date of Discharge: 11/08/2015 Attending Physician: Nelda Bucksaniel J Feinstein, MD  Discharge Diagnosis: Active Problems:   Acute respiratory failure [facility](HCC)   Encounter for intravenous line placement   Shock (HCC)   Altered mental status   Sepsis (HCC)  Cause of death: Cardiac arrest from Vfib in setting of hyperkalemia and multiorgan system failure from septic shock Time of death: 0940  Disposition and follow-up:   Catherine Ward [facility] condition.    Hospital Course: Catherine Ward [facility] for acute chest pain, respiratory failure, and hypothermia. She was started on intravenous antibiotics and fluid resuscitation for septic shock. She was supported with mechanical ventilation for her respiratory failure and pressor agents for severe hypotension despite fluid replacement. No evidence of STEMI on EKGs. CT scan was obtained of head and abdomen without a specific acute process identified. A RIJ HD catheter was placed with goal of initiating CRRT and LIJ for additional central venous access. She developed worsening renal failure and encephalopathy and became non-responsive despite discontinued sedation. Medical correction of multiple metabolic derangements including hyperkalemia was started without significant improvement. Later that morning she developed ventricular fibrillation then asystole. Chest compressions and defibrillation were not administered based on previous discussion with family the previous day.  Signed: Fuller Planhristopher W Ramsey Midgett, MD 11/13/2015, 10:03 AM

## 2015-11-21 DEATH — deceased

## 2017-06-13 IMAGING — CR DG CHEST 1V PORT
1 series · 1 of 1 positions shown · non-contrast
Comparison: 10/23/2015 .

CLINICAL DATA: Sepsis.

EXAM:
PORTABLE CHEST 1 VIEW

[AP]
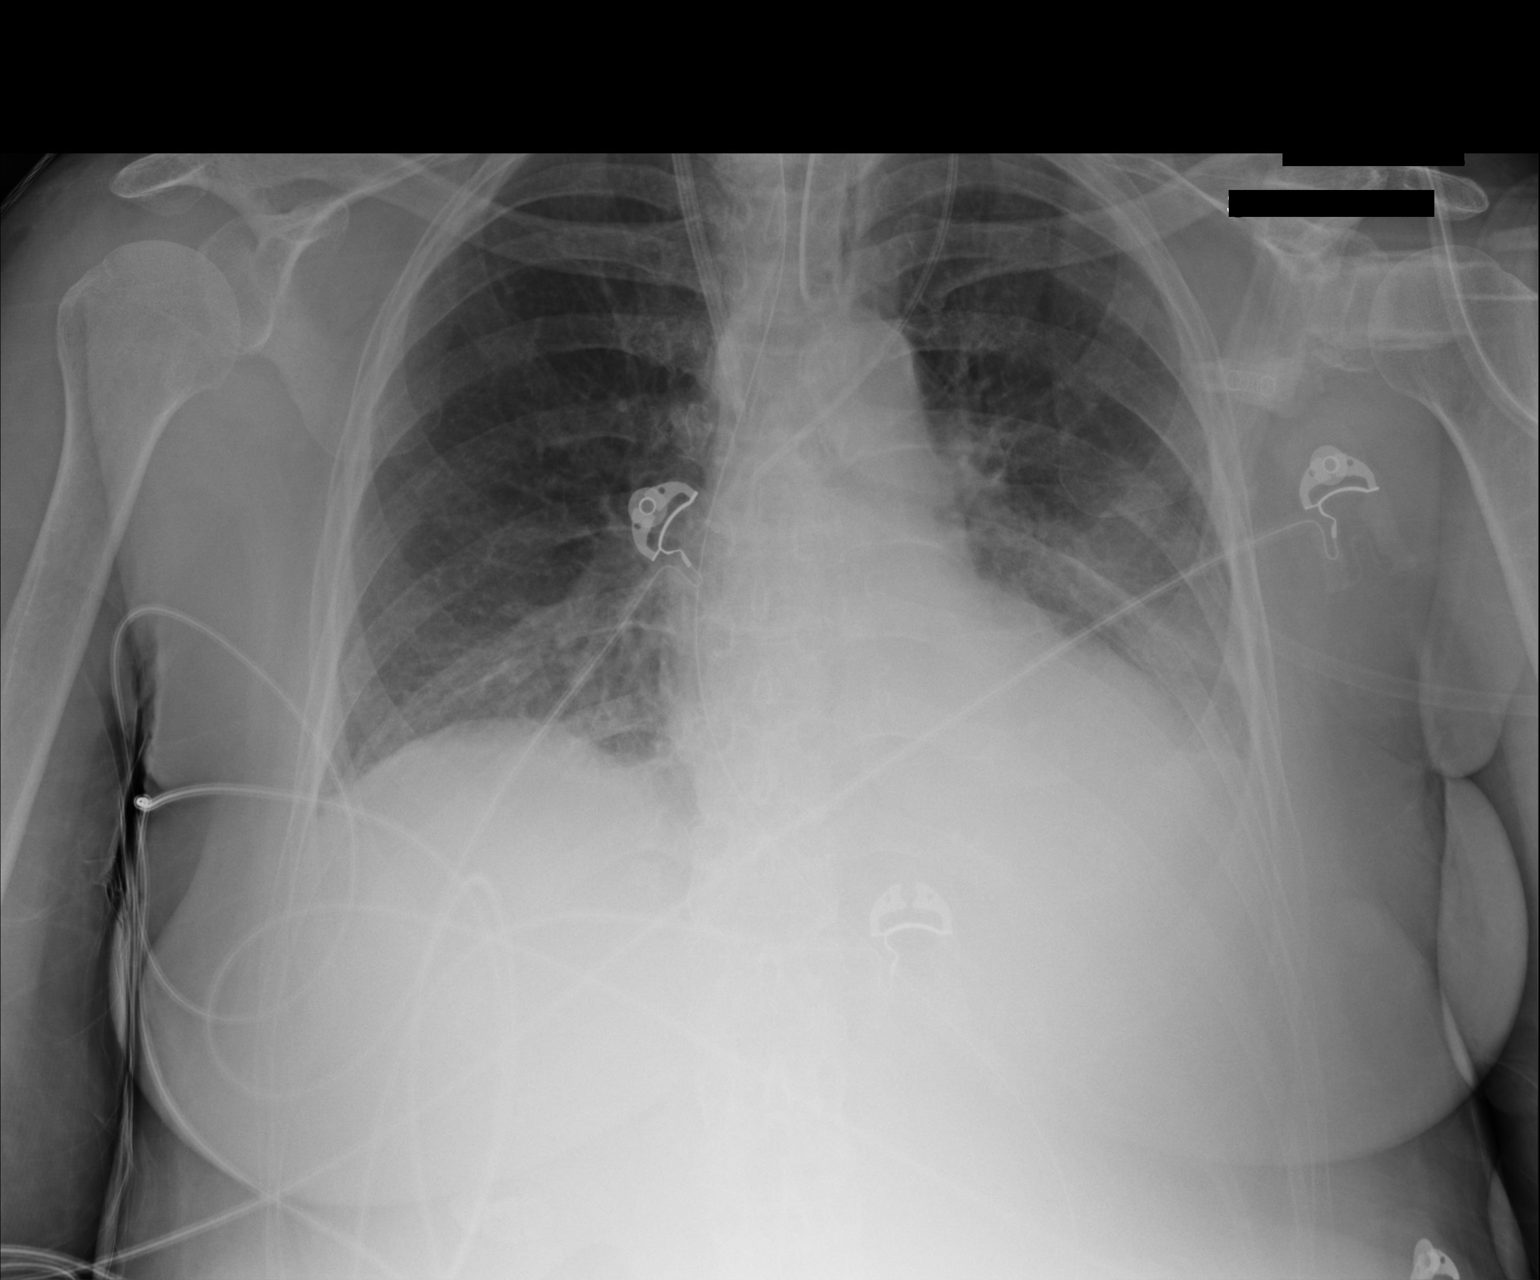

[1 of 1 positions shown; findings below may reference images not displayed]

FINDINGS: Endotracheal tube, right IJ line, NG tube in good anatomic position.
Bibasilar atelectasis and/or infiltrates. Small left pleural
effusion. No pneumothorax. Heart size normal.
IMPRESSION: 1. Lines and tubes in good anatomic position.

2. Bibasilar atelectasis and/or infiltrates. Interim slight
progression from prior exam.
